# Patient Record
Sex: Female | Born: 1940 | Race: Black or African American | Hispanic: No | Smoking: Current every day smoker
Health system: Southern US, Community
[De-identification: ages and names within clinical notes are randomized; demographics above are authoritative.]

## PROBLEM LIST (undated history)

## (undated) DIAGNOSIS — G20A1 Parkinson's disease without dyskinesia, without mention of fluctuations: Secondary | ICD-10-CM

## (undated) DIAGNOSIS — I509 Heart failure, unspecified: Secondary | ICD-10-CM

## (undated) DIAGNOSIS — K219 Gastro-esophageal reflux disease without esophagitis: Secondary | ICD-10-CM

## (undated) DIAGNOSIS — I2723 Pulmonary hypertension due to lung diseases and hypoxia: Secondary | ICD-10-CM

## (undated) DIAGNOSIS — F0390 Unspecified dementia without behavioral disturbance: Secondary | ICD-10-CM

## (undated) DIAGNOSIS — D696 Thrombocytopenia, unspecified: Secondary | ICD-10-CM

## (undated) DIAGNOSIS — G20 Parkinson's disease: Secondary | ICD-10-CM

## (undated) DIAGNOSIS — R32 Unspecified urinary incontinence: Secondary | ICD-10-CM

## (undated) DIAGNOSIS — M1990 Unspecified osteoarthritis, unspecified site: Secondary | ICD-10-CM

## (undated) DIAGNOSIS — F329 Major depressive disorder, single episode, unspecified: Secondary | ICD-10-CM

## (undated) DIAGNOSIS — F209 Schizophrenia, unspecified: Secondary | ICD-10-CM

## (undated) DIAGNOSIS — F32A Depression, unspecified: Secondary | ICD-10-CM

## (undated) DIAGNOSIS — J449 Chronic obstructive pulmonary disease, unspecified: Secondary | ICD-10-CM

## (undated) DIAGNOSIS — R4702 Dysphasia: Secondary | ICD-10-CM

## (undated) DIAGNOSIS — I10 Essential (primary) hypertension: Secondary | ICD-10-CM

## (undated) DIAGNOSIS — I739 Peripheral vascular disease, unspecified: Secondary | ICD-10-CM

## (undated) HISTORY — DX: Pulmonary hypertension due to lung diseases and hypoxia: I27.23

---

## 2009-11-07 ENCOUNTER — Encounter: Admission: RE | Admit: 2009-11-07 | Discharge: 2009-11-07 | Payer: Self-pay | Admitting: Family Medicine

## 2010-12-02 ENCOUNTER — Other Ambulatory Visit (HOSPITAL_COMMUNITY): Payer: Self-pay | Admitting: Family Medicine

## 2010-12-04 ENCOUNTER — Ambulatory Visit (HOSPITAL_COMMUNITY)
Admission: RE | Admit: 2010-12-04 | Discharge: 2010-12-04 | Disposition: A | Discharge status: Home / Self Care | Payer: Medicare Other | Source: Ambulatory Visit | Attending: Family Medicine | Admitting: Family Medicine

## 2010-12-04 DIAGNOSIS — Z01818 Encounter for other preprocedural examination: Secondary | ICD-10-CM | POA: Insufficient documentation

## 2010-12-04 DIAGNOSIS — R059 Cough, unspecified: Secondary | ICD-10-CM | POA: Insufficient documentation

## 2010-12-04 DIAGNOSIS — R1310 Dysphagia, unspecified: Secondary | ICD-10-CM | POA: Insufficient documentation

## 2010-12-04 DIAGNOSIS — R05 Cough: Secondary | ICD-10-CM | POA: Insufficient documentation

## 2010-12-24 ENCOUNTER — Other Ambulatory Visit: Payer: Self-pay | Admitting: Family Medicine

## 2010-12-24 DIAGNOSIS — Z1231 Encounter for screening mammogram for malignant neoplasm of breast: Secondary | ICD-10-CM

## 2011-01-29 ENCOUNTER — Other Ambulatory Visit: Payer: Medicare Other

## 2011-01-29 ENCOUNTER — Ambulatory Visit: Payer: Medicare Other

## 2011-02-09 ENCOUNTER — Ambulatory Visit: Payer: Medicare Other

## 2011-02-09 ENCOUNTER — Other Ambulatory Visit: Payer: Medicare Other

## 2011-02-17 ENCOUNTER — Other Ambulatory Visit: Payer: Medicare Other

## 2011-02-17 ENCOUNTER — Ambulatory Visit: Payer: Medicare Other

## 2011-03-05 ENCOUNTER — Ambulatory Visit
Admission: RE | Admit: 2011-03-05 | Discharge: 2011-03-05 | Disposition: A | Discharge status: Home / Self Care | Payer: Medicare Other | Source: Ambulatory Visit | Attending: Family Medicine | Admitting: Family Medicine

## 2011-03-05 DIAGNOSIS — Z1231 Encounter for screening mammogram for malignant neoplasm of breast: Secondary | ICD-10-CM

## 2011-06-15 ENCOUNTER — Emergency Department (HOSPITAL_COMMUNITY)
Admission: EM | Admit: 2011-06-15 | Discharge: 2011-06-15 | Disposition: A | Discharge status: Skilled Nursing Facility | Payer: Medicare Other | Attending: Emergency Medicine | Admitting: Emergency Medicine

## 2011-06-15 ENCOUNTER — Emergency Department (HOSPITAL_COMMUNITY): Payer: Medicare Other

## 2011-06-15 ENCOUNTER — Encounter (HOSPITAL_COMMUNITY): Payer: Self-pay | Admitting: *Deleted

## 2011-06-15 DIAGNOSIS — I498 Other specified cardiac arrhythmias: Secondary | ICD-10-CM | POA: Insufficient documentation

## 2011-06-15 DIAGNOSIS — Z8659 Personal history of other mental and behavioral disorders: Secondary | ICD-10-CM | POA: Insufficient documentation

## 2011-06-15 DIAGNOSIS — I10 Essential (primary) hypertension: Secondary | ICD-10-CM | POA: Insufficient documentation

## 2011-06-15 DIAGNOSIS — J449 Chronic obstructive pulmonary disease, unspecified: Secondary | ICD-10-CM | POA: Insufficient documentation

## 2011-06-15 DIAGNOSIS — J4489 Other specified chronic obstructive pulmonary disease: Secondary | ICD-10-CM | POA: Insufficient documentation

## 2011-06-15 DIAGNOSIS — Z79899 Other long term (current) drug therapy: Secondary | ICD-10-CM | POA: Insufficient documentation

## 2011-06-15 DIAGNOSIS — N390 Urinary tract infection, site not specified: Secondary | ICD-10-CM

## 2011-06-15 DIAGNOSIS — H409 Unspecified glaucoma: Secondary | ICD-10-CM | POA: Insufficient documentation

## 2011-06-15 DIAGNOSIS — I739 Peripheral vascular disease, unspecified: Secondary | ICD-10-CM | POA: Insufficient documentation

## 2011-06-15 DIAGNOSIS — F209 Schizophrenia, unspecified: Secondary | ICD-10-CM | POA: Insufficient documentation

## 2011-06-15 DIAGNOSIS — K219 Gastro-esophageal reflux disease without esophagitis: Secondary | ICD-10-CM | POA: Insufficient documentation

## 2011-06-15 DIAGNOSIS — M810 Age-related osteoporosis without current pathological fracture: Secondary | ICD-10-CM | POA: Insufficient documentation

## 2011-06-15 DIAGNOSIS — M129 Arthropathy, unspecified: Secondary | ICD-10-CM | POA: Insufficient documentation

## 2011-06-15 DIAGNOSIS — R002 Palpitations: Secondary | ICD-10-CM | POA: Insufficient documentation

## 2011-06-15 HISTORY — DX: Schizophrenia, unspecified: F20.9

## 2011-06-15 HISTORY — DX: Peripheral vascular disease, unspecified: I73.9

## 2011-06-15 HISTORY — DX: Essential (primary) hypertension: I10

## 2011-06-15 HISTORY — DX: Unspecified osteoarthritis, unspecified site: M19.90

## 2011-06-15 HISTORY — DX: Chronic obstructive pulmonary disease, unspecified: J44.9

## 2011-06-15 HISTORY — DX: Gastro-esophageal reflux disease without esophagitis: K21.9

## 2011-06-15 LAB — URINALYSIS, ROUTINE W REFLEX MICROSCOPIC
Bilirubin Urine: NEGATIVE
Ketones, ur: NEGATIVE mg/dL
Nitrite: POSITIVE — AB
Protein, ur: NEGATIVE mg/dL
Specific Gravity, Urine: 1.013 (ref 1.005–1.030)
Urobilinogen, UA: 0.2 mg/dL (ref 0.0–1.0)

## 2011-06-15 LAB — URINE MICROSCOPIC-ADD ON

## 2011-06-15 LAB — POCT I-STAT, CHEM 8
Chloride: 103 mEq/L (ref 96–112)
Glucose, Bld: 86 mg/dL (ref 70–99)
HCT: 41 % (ref 36.0–46.0)
Hemoglobin: 13.9 g/dL (ref 12.0–15.0)
Potassium: 4.3 mEq/L (ref 3.5–5.1)

## 2011-06-15 MED ORDER — CEPHALEXIN 500 MG PO CAPS: 500.0000 mg | ORAL_CAPSULE | Freq: Four times a day (QID) | ORAL | Status: AC

## 2011-06-15 NOTE — ED Notes (Signed)
From [facility] - staff reports noticed irregular pulse today. Pt denies any CP, palpitations. No voiced complaints

## 2011-06-15 NOTE — ED Notes (Signed)
Transport patient to [facility]

## 2011-06-15 NOTE — ED Provider Notes (Addendum)
Medical screening examination/treatment/procedure(s) were performed by non-physician practitioner and as supervising physician I was immediately available for consultation/collaboration.  Patient being evaluated for irregular heart beats. She is otherwise she does not know of any urinary tract symptoms. Evaluation is negative for toxic or metabolic abnormalities. Doubt ACS. Possible urinary tract infection. Patient stable for discharge.  Miranda Blade, MD 06/15/11 [place], MD 06/16/11 1911

## 2011-06-15 NOTE — ED Notes (Signed)
Placed call for [facility] for transport back home

## 2011-06-15 NOTE — ED Notes (Signed)
Pt denies SOB, CP, palpitations. Is unaware as to why she was sent here. No voiced complaints

## 2011-06-15 NOTE — ED Provider Notes (Signed)
History     CSN: [id number]  Arrival date & time 06/15/11  1815   First MD Initiated Contact with Patient 06/15/11 1847      Chief Complaint  Patient presents with  . Irregular Heart Beat    (Consider location/radiation/quality/duration/timing/severity/associated sxs/prior treatment) HPI Comments: Patient comes in today from [facility] because a nurse at the facility noticed that her heartbeat was irregular today while she was checking her pulse.  Patient reports that she felt her heart "flutter" today. She has never felt this before.  She denies any chest pain or SOB.  She denies nausea, vomiting, or diaphoresis.  Patient has a PMH significant for COPD and HTN.  No history of CAD or cardiac arrythmia.  Son also reports that the staff at the facility had noted that the patient is not smoking as much as usual, which they found to be unusual.   Staff at the nursing home has not noticed any increased confusion.  Patient denies any dysuria.  Patient is incontinent of urine at baseline.    The history is provided by the patient (son).    Past Medical History  Diagnosis Date  . Hypertension   . COPD (chronic obstructive pulmonary disease)   . Arthritis   . Schizophrenia   . Osteoporosis   . GERD (gastroesophageal reflux disease)   . Peripheral vascular disease   . Glaucoma     No past surgical history on file.  No family history on file.  History  Substance Use Topics  . Smoking status: Current Everyday Smoker  . Smokeless tobacco: Not on file  . Alcohol Use: No    OB History    Grav Para Term Preterm Abortions TAB SAB Ect Mult Living                  Review of Systems  Constitutional: Negative for fever, chills and diaphoresis.  HENT: Negative for neck pain and neck stiffness.   Respiratory: Negative for cough, shortness of breath and wheezing.   Cardiovascular: Positive for palpitations. Negative for chest pain.  Gastrointestinal: Negative for nausea, vomiting  and abdominal pain.  Genitourinary: Negative for dysuria and decreased urine volume.  Neurological: Negative for dizziness, syncope and light-headedness.  Psychiatric/Behavioral: Negative for confusion.    Allergies  Review of patient's allergies indicates no known allergies.  Home Medications   Current Outpatient Rx  Name Route Sig Dispense Refill  . ALBUTEROL SULFATE HFA 108 (90 BASE) MCG/ACT IN AERS Inhalation Inhale 2 puffs into the lungs every 6 (six) hours as needed. For wheezing    . CALCIUM CARBONATE-VITAMIN D 600-400 MG-UNIT PO TABS Oral Take 1 tablet by mouth 2 (two) times daily.    Marland Kitchen DIVALPROEX SODIUM 250 MG PO TBEC Oral Take 250 mg by mouth every morning.     Marland Kitchen DIVALPROEX SODIUM 500 MG PO TBEC Oral Take 500 mg by mouth at bedtime.    Marland Kitchen FLUTICASONE PROPIONATE 50 MCG/ACT NA SUSP Nasal Place 2 sprays into the nose daily.    Marland Kitchen LOPERAMIDE HCL 2 MG PO CAPS Oral Take 2 mg by mouth every 8 (eight) hours as needed. For loose stools    . OMEPRAZOLE 20 MG PO CPDR Oral Take 20 mg by mouth daily.    Marland Kitchen OXYMETAZOLINE HCL 0.05 % NA SOLN Nasal Place 2 sprays into the nose 2 (two) times daily as needed. For nose bleed    . RISPERIDONE 2 MG PO TBDP Oral Take 2 mg by mouth 2 (  two) times daily.    . TOLTERODINE TARTRATE ER 4 MG PO CP24 Oral Take 4 mg by mouth at bedtime.      BP 166/87  Temp(Src) 97.9 F (36.6 C) (Oral)  Resp 16  SpO2 95%  Physical Exam  Nursing note and vitals reviewed. Constitutional: She appears well-developed and well-nourished. No distress.  HENT:  Head: Normocephalic and atraumatic.  Mouth/Throat: Oropharynx is clear and moist.  Eyes: EOM are normal. Pupils are equal, round, and reactive to light.  Neck: Normal range of motion. Neck supple.  Cardiovascular: Normal rate and normal heart sounds.  An irregular rhythm present.  No murmur heard. Pulmonary/Chest: Effort normal and breath sounds normal. No respiratory distress. She has no wheezes. She has no rales. She  exhibits no tenderness.  Abdominal: Soft. She exhibits no distension and no mass. There is no tenderness. There is no rebound, no guarding and no CVA tenderness.  Neurological: She is alert.  Skin: Skin is warm and dry. No rash noted. She is not diaphoretic.  Psychiatric: She has a normal mood and affect.    ED Course  Procedures (including critical care time)  Labs Reviewed - No data to display No results found.   No diagnosis found.   Date: 06/16/2011  Rate: 69  Rhythm: premature ventricular contractions (PVC)  QRS Axis: normal  Intervals: normal  ST/T Wave abnormalities: nonspecific T wave changes  Conduction Disutrbances:none  Narrative Interpretation:   Old EKG Reviewed: none available    MDM  Patient comes in today after she was found to have an irregular heart beat at the Nursing Home that she lives at.  She reports that she could feel her heart "flutter"  No prior history of cardiac arrythmia.  PVC's noted on EKG.  She denies CP or SOB.  Labs unremarkable.  UA shows UTI.  Patient given Rx and urine culture sent.          Sherlyn Lees [place], PA-C 06/16/11 1534

## 2011-06-15 NOTE — ED Notes (Signed)
Patient transported to X-ray 

## 2011-06-15 NOTE — ED Notes (Signed)
EKG given to MD, no old EKG, copy placed in chart.

## 2011-06-15 NOTE — ED Notes (Signed)
Waiting for [facility] for transport back to facility

## 2011-06-15 NOTE — Discharge Instructions (Signed)
Urinary Tract Infection Infections of the urinary tract can start in several places. A bladder infection (cystitis), a kidney infection (pyelonephritis), and a prostate infection (prostatitis) are different types of urinary tract infections (UTIs). They usually get better if treated with medicines (antibiotics) that kill germs. Take all the medicine until it is gone. You or your child may feel better in a few days, but TAKE ALL MEDICINE or the infection may not respond and may become more difficult to treat. HOME CARE INSTRUCTIONS   Drink enough water and fluids to keep the urine clear or pale yellow. Cranberry juice is especially recommended, in addition to large amounts of water.   Avoid caffeine, tea, and carbonated beverages. They tend to irritate the bladder.   Alcohol may irritate the prostate.   Only take over-the-counter or prescription medicines for pain, discomfort, or fever as directed by your caregiver.  To prevent further infections:  Empty the bladder often. Avoid holding urine for long periods of time.   After a bowel movement, women should cleanse from front to back. Use each tissue only once.   Empty the bladder before and after sexual intercourse.  FINDING OUT THE RESULTS OF YOUR TEST Not all test results are available during your visit. If your or your child's test results are not back during the visit, make an appointment with your caregiver to find out the results. Do not assume everything is normal if you have not heard from your caregiver or the medical facility. It is important for you to follow up on all test results. SEEK MEDICAL CARE IF:   There is back pain.   Your baby is older than 3 months with a rectal temperature of 100.5 F (38.1 C) or higher for more than 1 day.   Your or your child's problems (symptoms) are no better in 3 days. Return sooner if you or your child is getting worse.  SEEK IMMEDIATE MEDICAL CARE IF:   There is severe back pain or lower  abdominal pain.   You or your child develops chills.   You have a fever.   Your baby is older than 3 months with a rectal temperature of 102 F (38.9 C) or higher.   Your baby is 63 months old or younger with a rectal temperature of 100.4 F (38 C) or higher.   There is nausea or vomiting.   There is continued burning or discomfort with urination.  MAKE SURE YOU:   Understand these instructions.   Will watch your condition.   Will get help right away if you are not doing well or get worse.  Document Released: 10/29/2004 Document Revised: 01/08/2011 Document Reviewed: 06/03/2006 [facility] Patient Information 2012 [place].

## 2011-06-16 NOTE — ED Provider Notes (Signed)
Medical screening examination/treatment/procedure(s) were conducted as a shared visit with non-physician practitioner(s) and myself.  I personally evaluated the patient during the encounter  Richarda Blade, MD 06/16/11 [phone]

## 2011-06-17 LAB — URINE CULTURE

## 2011-06-18 NOTE — ED Notes (Signed)
+  Urine Patient treated with keflex-sensitive to same-chart appended per protocol MD.

## 2011-07-08 ENCOUNTER — Ambulatory Visit (INDEPENDENT_AMBULATORY_CARE_PROVIDER_SITE_OTHER): Payer: Medicare Other | Admitting: Emergency Medicine

## 2011-07-08 ENCOUNTER — Ambulatory Visit: Payer: Medicare Other

## 2011-07-08 VITALS — BP 148/64 | HR 61 | Temp 98.0°F | Resp 20 | Wt 120.0 lb

## 2011-07-08 DIAGNOSIS — R059 Cough, unspecified: Secondary | ICD-10-CM

## 2011-07-08 DIAGNOSIS — J449 Chronic obstructive pulmonary disease, unspecified: Secondary | ICD-10-CM

## 2011-07-08 DIAGNOSIS — R05 Cough: Secondary | ICD-10-CM

## 2011-07-08 DIAGNOSIS — N390 Urinary tract infection, site not specified: Secondary | ICD-10-CM

## 2011-07-08 DIAGNOSIS — I7389 Other specified peripheral vascular diseases: Secondary | ICD-10-CM

## 2011-07-08 DIAGNOSIS — N3000 Acute cystitis without hematuria: Secondary | ICD-10-CM

## 2011-07-08 DIAGNOSIS — R300 Dysuria: Secondary | ICD-10-CM

## 2011-07-08 LAB — POCT UA - MICROSCOPIC ONLY
Casts, Ur, LPF, POC: NEGATIVE
Crystals, Ur, HPF, POC: NEGATIVE

## 2011-07-08 LAB — POCT URINALYSIS DIPSTICK
Bilirubin, UA: NEGATIVE
Glucose, UA: NEGATIVE
Nitrite, UA: POSITIVE
Spec Grav, UA: <=1.005
Urobilinogen, UA: 0.2
pH, UA: 6

## 2011-07-08 MED ORDER — CIPROFLOXACIN HCL 500 MG PO TABS: 500.0000 mg | ORAL_TABLET | Freq: Two times a day (BID) | ORAL | Status: AC

## 2011-07-08 NOTE — Progress Notes (Signed)
Subjective:    Patient ID: Miranda Ballard, female    DOB: Jul 29, 1940, 71 y.o.   MRN: [id number]  Cough This is a new problem. The current episode started in the past 7 days. The problem has been gradually worsening. The problem occurs every few minutes. The cough is productive of purulent sputum. Associated symptoms include a fever. Pertinent negatives include no chest pain, chills, ear congestion, ear pain, headaches, heartburn, hemoptysis, myalgias, nasal congestion, postnasal drip, rash, rhinorrhea, sore throat, shortness of breath, sweats, weight loss or wheezing. The symptoms are aggravated by lying down and exercise. She has tried a beta-agonist inhaler for the symptoms. The treatment provided mild relief. Her past medical history is significant for bronchitis, COPD and emphysema.  URI  This is a recurrent problem. The current episode started today. The problem has been unchanged. The maximum temperature recorded prior to her arrival was 101 - 101.9 F. The fever has been present for 1 to 2 days. Associated symptoms include coughing and dysuria. Pertinent negatives include no chest pain, ear pain, headaches, joint pain, joint swelling, nausea, neck pain, rash, rhinorrhea, sore throat or wheezing. She has tried nothing for the symptoms.  Hypertension This is a chronic problem. The current episode started more than 1 year ago. The problem is unchanged. The problem is controlled. Pertinent negatives include no chest pain, headaches, neck pain, shortness of breath or sweats. There are no associated agents to hypertension. Risk factors for coronary artery disease include family history, post-menopausal state, sedentary lifestyle and smoking/tobacco exposure.      Review of Systems  Constitutional: Positive for fever, appetite change and fatigue. Negative for chills, weight loss, diaphoresis and activity change.  HENT: Negative for ear pain, sore throat, rhinorrhea, neck pain and postnasal drip.   Eyes:  Negative.   Respiratory: Positive for cough. Negative for hemoptysis, choking, chest tightness, shortness of breath, wheezing and stridor.   Cardiovascular: Negative for chest pain.  Gastrointestinal: Negative.  Negative for heartburn and nausea.  Genitourinary: Positive for dysuria, urgency and enuresis.  Musculoskeletal: Negative.  Negative for myalgias and joint pain.  Skin: Negative for rash.  Neurological: Positive for speech difficulty. Negative for dizziness, seizures, facial asymmetry and headaches.       Objective:   Physical Exam  Constitutional: She appears cachectic. She is active and uncooperative.  Non-toxic appearance. She does not have a sickly appearance. She appears ill. No distress.  HENT:  Head: Normocephalic and atraumatic.  Right Ear: External ear normal.  Left Ear: External ear normal.  Mouth/Throat: Oropharynx is clear and moist.  Eyes: Conjunctivae and EOM are normal. Pupils are equal, round, and reactive to light.  Neck: Normal range of motion. Neck supple. No tracheal deviation present. No thyromegaly present.  Cardiovascular: Normal rate, regular rhythm and normal heart sounds.   Pulmonary/Chest: Effort normal. No respiratory distress. She has rales.  Abdominal: Soft. There is no tenderness.  Neurological: She is disoriented. She displays atrophy. She exhibits abnormal muscle tone. Coordination abnormal.  Skin: Skin is warm and dry.          Assessment & Plan:   Results for orders placed in visit on 07/08/11  POCT URINALYSIS DIPSTICK      Component Value Range   Color, UA yellow     Clarity, UA cloudy     Glucose, UA neg     Bilirubin, UA neg     Ketones, UA neg     Spec Grav, UA <=1.005     Blood,  UA small     pH, UA 6.0     Protein, UA neg     Urobilinogen, UA 0.2     Nitrite, UA pos     Leukocytes, UA small (1+)    POCT UA - MICROSCOPIC ONLY      Component Value Range   WBC, Ur, HPF, POC 20-30     RBC, urine, microscopic 1-3      Bacteria, U Microscopic 3+     Mucus, UA neg     Epithelial cells, urine per micros 1-4     Crystals, Ur, HPF, POC neg     Casts, Ur, LPF, POC neg     Yeast, UA neg

## 2011-07-08 NOTE — Patient Instructions (Signed)
Urinary Tract Infection Infections of the urinary tract can start in several places. A bladder infection (cystitis), a kidney infection (pyelonephritis), and a prostate infection (prostatitis) are different types of urinary tract infections (UTIs). They usually get better if treated with medicines (antibiotics) that kill germs. Take all the medicine until it is gone. You or your child may feel better in a few days, but TAKE ALL MEDICINE or the infection may not respond and may become more difficult to treat. HOME CARE INSTRUCTIONS   Drink enough water and fluids to keep the urine clear or pale yellow. Cranberry juice is especially recommended, in addition to large amounts of water.   Avoid caffeine, tea, and carbonated beverages. They tend to irritate the bladder.   Alcohol may irritate the prostate.   Only take over-the-counter or prescription medicines for pain, discomfort, or fever as directed by your caregiver.  To prevent further infections:  Empty the bladder often. Avoid holding urine for long periods of time.   After a bowel movement, women should cleanse from front to back. Use each tissue only once.   Empty the bladder before and after sexual intercourse.  FINDING OUT THE RESULTS OF YOUR TEST Not all test results are available during your visit. If your or your child's test results are not back during the visit, make an appointment with your caregiver to find out the results. Do not assume everything is normal if you have not heard from your caregiver or the medical facility. It is important for you to follow up on all test results. SEEK MEDICAL CARE IF:   There is back pain.   Your baby is older than 3 months with a rectal temperature of 100.5 F (38.1 C) or higher for more than 1 day.   Your or your child's problems (symptoms) are no better in 3 days. Return sooner if you or your child is getting worse.  SEEK IMMEDIATE MEDICAL CARE IF:   There is severe back pain or lower  abdominal pain.   You or your child develops chills.   You have a fever.   Your baby is older than 3 months with a rectal temperature of 102 F (38.9 C) or higher.   Your baby is 54 months old or younger with a rectal temperature of 100.4 F (38 C) or higher.   There is nausea or vomiting.   There is continued burning or discomfort with urination.  MAKE SURE YOU:   Understand these instructions.   Will watch your condition.   Will get help right away if you are not doing well or get worse.  Document Released: 10/29/2004 Document Revised: 01/08/2011 Document Reviewed: 06/03/2006 [facility] Patient Information 2012 [place].

## 2011-07-08 NOTE — Progress Notes (Signed)
  Subjective:    Patient ID: Miranda Ballard, female    DOB: May 08, 1940, 71 y.o.   MRN: [id number]  HPI    Review of Systems     Objective:   Physical Exam        Assessment & Plan:  [facility] reading (PRIMARY) by  Dr.Marquerite Forsman.  Negative.

## 2012-01-23 ENCOUNTER — Emergency Department (HOSPITAL_COMMUNITY): Payer: Medicare Other

## 2012-01-23 ENCOUNTER — Encounter (HOSPITAL_COMMUNITY): Payer: Self-pay | Admitting: Emergency Medicine

## 2012-01-23 ENCOUNTER — Inpatient Hospital Stay (HOSPITAL_COMMUNITY): Payer: Medicare Other

## 2012-01-23 ENCOUNTER — Inpatient Hospital Stay (HOSPITAL_COMMUNITY)
Admission: EM | Admit: 2012-01-23 | Discharge: 2012-01-26 | DRG: 194 | Disposition: A | Discharge status: Skilled Nursing Facility | Payer: Medicare Other | Attending: Internal Medicine | Admitting: Internal Medicine

## 2012-01-23 DIAGNOSIS — R0602 Shortness of breath: Secondary | ICD-10-CM | POA: Diagnosis present

## 2012-01-23 DIAGNOSIS — J189 Pneumonia, unspecified organism: Principal | ICD-10-CM | POA: Diagnosis present

## 2012-01-23 DIAGNOSIS — E871 Hypo-osmolality and hyponatremia: Secondary | ICD-10-CM | POA: Diagnosis present

## 2012-01-23 DIAGNOSIS — J4489 Other specified chronic obstructive pulmonary disease: Secondary | ICD-10-CM | POA: Diagnosis present

## 2012-01-23 DIAGNOSIS — D72829 Elevated white blood cell count, unspecified: Secondary | ICD-10-CM | POA: Diagnosis present

## 2012-01-23 DIAGNOSIS — I739 Peripheral vascular disease, unspecified: Secondary | ICD-10-CM | POA: Diagnosis present

## 2012-01-23 DIAGNOSIS — K219 Gastro-esophageal reflux disease without esophagitis: Secondary | ICD-10-CM | POA: Diagnosis present

## 2012-01-23 DIAGNOSIS — J449 Chronic obstructive pulmonary disease, unspecified: Secondary | ICD-10-CM

## 2012-01-23 DIAGNOSIS — F209 Schizophrenia, unspecified: Secondary | ICD-10-CM | POA: Diagnosis present

## 2012-01-23 DIAGNOSIS — Z79899 Other long term (current) drug therapy: Secondary | ICD-10-CM

## 2012-01-23 DIAGNOSIS — H409 Unspecified glaucoma: Secondary | ICD-10-CM | POA: Diagnosis present

## 2012-01-23 LAB — URINALYSIS, ROUTINE W REFLEX MICROSCOPIC
Bilirubin Urine: NEGATIVE
Glucose, UA: NEGATIVE mg/dL
Hgb urine dipstick: NEGATIVE
Ketones, ur: NEGATIVE mg/dL
Leukocytes, UA: NEGATIVE
Nitrite: NEGATIVE
Protein, ur: NEGATIVE mg/dL
Specific Gravity, Urine: 1.012 (ref 1.005–1.030)
Urobilinogen, UA: 0.2 mg/dL (ref 0.0–1.0)
pH: 8 (ref 5.0–8.0)

## 2012-01-23 LAB — BASIC METABOLIC PANEL
BUN: 10 mg/dL (ref 6–23)
CO2: 24 mEq/L (ref 19–32)
Calcium: 10.1 mg/dL (ref 8.4–10.5)
Chloride: 93 mEq/L — ABNORMAL LOW (ref 96–112)
Creatinine, Ser: 0.86 mg/dL (ref 0.50–1.10)
GFR calc Af Amer: 78 mL/min — ABNORMAL LOW (ref >90)
GFR calc non Af Amer: 67 mL/min — ABNORMAL LOW (ref >90)
Glucose, Bld: 88 mg/dL (ref 70–99)
Potassium: 4.6 mEq/L (ref 3.5–5.1)
Sodium: 126 mEq/L — ABNORMAL LOW (ref 135–145)

## 2012-01-23 LAB — CBC WITH DIFFERENTIAL/PLATELET
Basophils Absolute: 0 10*3/uL (ref 0.0–0.1)
Basophils Relative: 0 % (ref 0–1)
Eosinophils Absolute: 0 10*3/uL (ref 0.0–0.7)
Eosinophils Relative: 0 % (ref 0–5)
HCT: 37.5 % (ref 36.0–46.0)
Hemoglobin: 12.8 g/dL (ref 12.0–15.0)
Lymphocytes Relative: 9 % — ABNORMAL LOW (ref 12–46)
Lymphs Abs: 1.1 10*3/uL (ref 0.7–4.0)
MCH: 33.1 pg (ref 26.0–34.0)
MCHC: 34.1 g/dL (ref 30.0–36.0)
MCV: 96.9 fL (ref 78.0–100.0)
Monocytes Absolute: 1.9 10*3/uL — ABNORMAL HIGH (ref 0.1–1.0)
Monocytes Relative: 15 % — ABNORMAL HIGH (ref 3–12)
Neutro Abs: 9.9 10*3/uL — ABNORMAL HIGH (ref 1.7–7.7)
Neutrophils Relative %: 76 % (ref 43–77)
Platelets: 179 10*3/uL (ref 150–400)
RBC: 3.87 MIL/uL (ref 3.87–5.11)
RDW: 13 % (ref 11.5–15.5)
WBC: 13 10*3/uL — ABNORMAL HIGH (ref 4.0–10.5)

## 2012-01-23 LAB — LACTIC ACID, PLASMA: Lactic Acid, Venous: 1.8 mmol/L (ref 0.5–2.2)

## 2012-01-23 LAB — STREP PNEUMONIAE URINARY ANTIGEN: Strep Pneumo Urinary Antigen: NEGATIVE

## 2012-01-23 MED ORDER — DIVALPROEX SODIUM 250 MG PO DR TAB
250.0000 mg | DELAYED_RELEASE_TABLET | Freq: Every morning | ORAL | Status: DC
  Administered 2012-01-23 – 2012-01-26 (×4): 250 mg via ORAL
  Filled 2012-01-23 (×4): qty 1

## 2012-01-23 MED ORDER — LORATADINE 10 MG PO TABS
10.0000 mg | ORAL_TABLET | Freq: Every day | ORAL | Status: DC
  Administered 2012-01-23 – 2012-01-26 (×4): 10 mg via ORAL
  Filled 2012-01-23 (×4): qty 1

## 2012-01-23 MED ORDER — DEXTROSE 5 % IV SOLN
1.0000 g | Freq: Once | INTRAVENOUS | Status: AC
  Administered 2012-01-23: 1 g via INTRAVENOUS
  Filled 2012-01-23: qty 10

## 2012-01-23 MED ORDER — SODIUM CHLORIDE 0.9 % IV SOLN
INTRAVENOUS | Status: AC
  Administered 2012-01-23: 14:00:00 via INTRAVENOUS

## 2012-01-23 MED ORDER — FLUTICASONE PROPIONATE 50 MCG/ACT NA SUSP
2.0000 | Freq: Every day | NASAL | Status: DC
  Administered 2012-01-23 – 2012-01-26 (×4): 2 via NASAL
  Filled 2012-01-23: qty 16

## 2012-01-23 MED ORDER — ENOXAPARIN SODIUM 30 MG/0.3ML ~~LOC~~ SOLN
30.0000 mg | SUBCUTANEOUS | Status: DC
  Administered 2012-01-23: 30 mg via SUBCUTANEOUS
  Filled 2012-01-23 (×2): qty 0.3

## 2012-01-23 MED ORDER — DEXTROSE 5 % IV SOLN
500.0000 mg | Freq: Once | INTRAVENOUS | Status: AC
  Administered 2012-01-23: 500 mg via INTRAVENOUS
  Filled 2012-01-23: qty 500

## 2012-01-23 MED ORDER — LEVOFLOXACIN IN D5W 750 MG/150ML IV SOLN
750.0000 mg | INTRAVENOUS | Status: DC
  Administered 2012-01-23 – 2012-01-26 (×4): 750 mg via INTRAVENOUS
  Filled 2012-01-23 (×4): qty 150

## 2012-01-23 MED ORDER — MOMETASONE FURO-FORMOTEROL FUM 100-5 MCG/ACT IN AERO
2.0000 | INHALATION_SPRAY | Freq: Two times a day (BID) | RESPIRATORY_TRACT | Status: DC
  Administered 2012-01-23 – 2012-01-26 (×7): 2 via RESPIRATORY_TRACT
  Filled 2012-01-23: qty 8.8

## 2012-01-23 MED ORDER — ALBUTEROL SULFATE HFA 108 (90 BASE) MCG/ACT IN AERS
2.0000 | INHALATION_SPRAY | Freq: Four times a day (QID) | RESPIRATORY_TRACT | Status: DC
  Administered 2012-01-23 – 2012-01-26 (×10): 2 via RESPIRATORY_TRACT
  Filled 2012-01-23: qty 6.7

## 2012-01-23 MED ORDER — LOPERAMIDE HCL 2 MG PO CAPS: 2.0000 mg | ORAL_CAPSULE | Freq: Three times a day (TID) | ORAL | Status: DC | PRN

## 2012-01-23 MED ORDER — ASPIRIN 81 MG PO CHEW
324.0000 mg | CHEWABLE_TABLET | Freq: Once | ORAL | Status: AC
  Administered 2012-01-23: 324 mg via ORAL
  Filled 2012-01-23: qty 4

## 2012-01-23 MED ORDER — RISPERIDONE 2 MG PO TBDP
2.0000 mg | ORAL_TABLET | Freq: Two times a day (BID) | ORAL | Status: DC
Start: 2012-01-23 — End: 2012-01-26
  Administered 2012-01-23 – 2012-01-26 (×7): 2 mg via ORAL
  Filled 2012-01-23 (×9): qty 1

## 2012-01-23 MED ORDER — OXYMETAZOLINE HCL 0.05 % NA SOLN
2.0000 | Freq: Two times a day (BID) | NASAL | Status: DC | PRN
  Administered 2012-01-24: 2 via NASAL
  Filled 2012-01-23: qty 15

## 2012-01-23 MED ORDER — DEXTROSE 5 % IV SOLN
1.0000 g | Freq: Three times a day (TID) | INTRAVENOUS | Status: DC
  Administered 2012-01-23 – 2012-01-26 (×10): 1 g via INTRAVENOUS
  Filled 2012-01-23 (×11): qty 1

## 2012-01-23 MED ORDER — VANCOMYCIN HCL 500 MG IV SOLR
500.0000 mg | Freq: Two times a day (BID) | INTRAVENOUS | Status: DC
  Administered 2012-01-23 – 2012-01-25 (×4): 500 mg via INTRAVENOUS
  Filled 2012-01-23 (×5): qty 500

## 2012-01-23 MED ORDER — AZITHROMYCIN 500 MG PO TABS
500.0000 mg | ORAL_TABLET | ORAL | Status: DC
  Filled 2012-01-23: qty 1

## 2012-01-23 MED ORDER — PANTOPRAZOLE SODIUM 40 MG PO TBEC
40.0000 mg | DELAYED_RELEASE_TABLET | Freq: Every day | ORAL | Status: DC
  Administered 2012-01-23 – 2012-01-26 (×4): 40 mg via ORAL
  Filled 2012-01-23 (×5): qty 1

## 2012-01-23 MED ORDER — MONTELUKAST SODIUM 10 MG PO TABS
10.0000 mg | ORAL_TABLET | Freq: Every day | ORAL | Status: DC
  Administered 2012-01-23 – 2012-01-26 (×4): 10 mg via ORAL
  Filled 2012-01-23 (×4): qty 1

## 2012-01-23 MED ORDER — FESOTERODINE FUMARATE ER 4 MG PO TB24
8.0000 mg | ORAL_TABLET | Freq: Every day | ORAL | Status: DC
  Administered 2012-01-23 – 2012-01-26 (×4): 8 mg via ORAL
  Filled 2012-01-23 (×4): qty 2

## 2012-01-23 MED ORDER — DEXTROSE 5 % IV SOLN
1.0000 g | INTRAVENOUS | Status: DC
  Filled 2012-01-23: qty 10

## 2012-01-23 MED ORDER — DIVALPROEX SODIUM 500 MG PO DR TAB
500.0000 mg | DELAYED_RELEASE_TABLET | Freq: Every day | ORAL | Status: DC
  Administered 2012-01-23 – 2012-01-25 (×3): 500 mg via ORAL
  Filled 2012-01-23 (×5): qty 1

## 2012-01-23 NOTE — Progress Notes (Signed)
ANTIBIOTIC CONSULT NOTE - INITIAL  Pharmacy Consult for Vancomycin/renal adjustment of other antibiotics Indication: pneumonia  No Known Allergies  Patient Measurements:   Weight from July 2013: 54.4 kg - nursing to update weight and height and enter in EPIC  Vital Signs: Temp: 98.7 F (37.1 C) (12/21 1030) Temp src: Oral (12/21 1030) BP: 120/82 mmHg (12/21 1030) Pulse Rate: 67  (12/21 1030) Intake/Output from previous day:   Intake/Output from this shift:    Labs:  [facility] 01/23/12 0459  WBC 13.0*  HGB 12.8  PLT 179  LABCREA --  CREATININE 0.86   CrCl is unknown because there is no height on file for the current visit. No results found for this basename: VANCOTROUGH:2,VANCOPEAK:2,VANCORANDOM:2,GENTTROUGH:2,GENTPEAK:2,GENTRANDOM:2,TOBRATROUGH:2,TOBRAPEAK:2,TOBRARND:2,AMIKACINPEAK:2,AMIKACINTROU:2,AMIKACIN:2, in the last 72 hours   CrCl CG (using weight of 54kg from July 2013): 52 ml/min CrCl N: 69 ml/min  Microbiology: No results found for this or any previous visit (from the past 720 hour(s)).  Medical History: Past Medical History  Diagnosis Date  . Hypertension   . COPD (chronic obstructive pulmonary disease)   . Arthritis   . Schizophrenia   . Osteoporosis   . GERD (gastroesophageal reflux disease)   . Peripheral vascular disease   . Glaucoma(365)     Medications:  Scheduled:    . albuterol  2 puff Inhalation Q6H  . [COMPLETED] aspirin  324 mg Oral Once  . [COMPLETED] azithromycin (ZITHROMAX) 500 MG IVPB  500 mg Intravenous Once  . ceFEPime (MAXIPIME) IV  1 g Intravenous Q8H  . [COMPLETED] cefTRIAXone (ROCEPHIN)  IV  1 g Intravenous Once  . divalproex  250 mg Oral q morning - 10a  . divalproex  500 mg Oral QHS  . enoxaparin  30 mg Subcutaneous Q24H  . fesoterodine  8 mg Oral Daily  . fluticasone  2 spray Each Nare Daily  . levofloxacin (LEVAQUIN) IV  750 mg Intravenous Q24H  . loratadine  10 mg Oral Daily  . mometasone-formoterol  2 puff  Inhalation BID  . montelukast  10 mg Oral Daily  . pantoprazole  40 mg Oral Daily  . risperiDONE  2 mg Oral BID  . [DISCONTINUED] azithromycin  500 mg Oral Q24H  . [DISCONTINUED] cefTRIAXone (ROCEPHIN)  IV  1 g Intravenous Q24H   Anti-infectives     Start     Dose/Rate Route Frequency Ordered Stop   01/23/12 1400   ceFEPIme (MAXIPIME) 1 g in dextrose 5 % 50 mL IVPB        1 g 100 mL/hr over 30 Minutes Intravenous 3 times per day 01/23/12 1131 01/31/12 1359   01/23/12 1145   levofloxacin (LEVAQUIN) IVPB 750 mg        750 mg 100 mL/hr over 90 Minutes Intravenous Every 24 hours 01/23/12 1131 01/26/12 1144   01/23/12 1100   cefTRIAXone (ROCEPHIN) 1 g in dextrose 5 % 50 mL IVPB  Status:  Discontinued        1 g 100 mL/hr over 30 Minutes Intravenous Every 24 hours 01/23/12 1028 01/23/12 1131   01/23/12 1100   azithromycin (ZITHROMAX) tablet 500 mg  Status:  Discontinued        500 mg Oral Every 24 hours 01/23/12 1028 01/23/12 1131   01/23/12 0730   cefTRIAXone (ROCEPHIN) 1 g in dextrose 5 % 50 mL IVPB        1 g 100 mL/hr over 30 Minutes Intravenous  Once 01/23/12 0719 01/23/12 0840   01/23/12 0730   azithromycin (ZITHROMAX) 500  mg in dextrose 5 % 250 mL IVPB        500 mg 250 mL/hr over 60 Minutes Intravenous  Once 01/23/12 0719 01/23/12 0950         Assessment: 71 YO F presented to the ED with SOB, productive cough, fevers and chills. Pharmacy asked to start Vancomycin x 8 days per pneumonia protocol. Patient received 1 dose of azithromycin and ceftriaxone in the ED.  12/21 >> Azithromycin >> 12/21 12/21 >> Ceftriaxone >> 12/21 12/21 >> Levaquin x 3 days >> 12/24 12/21 >> Cefepime x 8 days >> 12/29 12/21 >> Vancomycin x 8 days >> 12/29   Goal of Therapy:  Vancomycin trough level 15-20 mcg/ml  Plan:  1. Start Vancomcyin 500 mg IV every 12 hours 2. Continue Cefepime 1 g every 8 hours (appropriate for renal function) 3. Continue Levaquin 750 mg IV every 24 hours  (appropriate for renal function) 4. Vancomycin trough at steady state 5. Nursing informed to update height and weight in [facility], Pharm.D. Clinical Oncology Pharmacist  Pager # [phone]  01/23/2012,11:39 AM

## 2012-01-23 NOTE — ED Notes (Signed)
Patient transported to X-ray

## 2012-01-23 NOTE — ED Provider Notes (Signed)
History     CSN: [id number]  Arrival date & time 01/23/12  0400   First MD Initiated Contact with Patient 01/23/12 [phone]      Chief Complaint  Patient presents with  . Weakness  . Fever    (Consider location/radiation/quality/duration/timing/severity/associated sxs/prior treatment) HPI Patient presents to the emergency department with fever, and weakness, that started tonight.  Patient, states she's had some cough over the last week.  Patient denies nausea, vomiting, diarrhea, abdominal pain, chest pain, shortness of breath, headache, visual changes, dizziness, or syncope. Patient lives in a nursing home, and was found.the nurse, complaining of weakness, fever.  Patient was not given any treatment prior to arrival. Past Medical History  Diagnosis Date  . Hypertension   . COPD (chronic obstructive pulmonary disease)   . Arthritis   . Schizophrenia   . Osteoporosis   . GERD (gastroesophageal reflux disease)   . Peripheral vascular disease   . Glaucoma(365)     History reviewed. No pertinent past surgical history.  No family history on file.  History  Substance Use Topics  . Smoking status: Current Every Day Smoker  . Smokeless tobacco: Not on file  . Alcohol Use: No    OB History    Grav Para Term Preterm Abortions TAB SAB Ect Mult Living                  Review of Systems All other systems negative except as documented in the HPI. All pertinent positives and negatives as reviewed in the HPI.  Allergies  Review of patient's allergies indicates no known allergies.  Home Medications   Current Outpatient Rx  Name  Route  Sig  Dispense  Refill  . ALBUTEROL SULFATE HFA 108 (90 BASE) MCG/ACT IN AERS   Inhalation   Inhale 2 puffs into the lungs every 6 (six) hours. Scheduled per nursing home mar.For wheezing         . CALCIUM CARBONATE-VITAMIN D 600-400 MG-UNIT PO TABS   Oral   Take 1 tablet by mouth 2 (two) times daily.         Marland Kitchen DIVALPROEX SODIUM 250 MG  PO TBEC   Oral   Take 250 mg by mouth every morning.          Marland Kitchen DIVALPROEX SODIUM 500 MG PO TBEC   Oral   Take 500 mg by mouth at bedtime.         Marland Kitchen FLUTICASONE PROPIONATE 50 MCG/ACT NA SUSP   Nasal   Place 2 sprays into the nose daily. scheduled         . FLUTICASONE-SALMETEROL 250-50 MCG/DOSE IN AEPB   Inhalation   Inhale 1 puff into the lungs every 12 (twelve) hours. scheduled         . LORATADINE 10 MG PO TABS   Oral   Take 10 mg by mouth daily. scheduled         . MONTELUKAST SODIUM 10 MG PO TABS   Oral   Take 10 mg by mouth daily.         Marland Kitchen OMEPRAZOLE 20 MG PO CPDR   Oral   Take 20 mg by mouth daily.         Marland Kitchen RISPERIDONE 2 MG PO TBDP   Oral   Take 2 mg by mouth 2 (two) times daily.         . TOLTERODINE TARTRATE ER 4 MG PO CP24   Oral   Take 4 mg by mouth  at bedtime.         Marland Kitchen LOPERAMIDE HCL 2 MG PO CAPS   Oral   Take 2 mg by mouth every 8 (eight) hours as needed. For loose stools         . OXYMETAZOLINE HCL 0.05 % NA SOLN   Nasal   Place 2 sprays into the nose 2 (two) times daily as needed. For nose bleed           BP 141/84  Pulse 78  Temp 101.1 F (38.4 C) (Oral)  Resp 20  SpO2 96%  Physical Exam  Nursing note and vitals reviewed. Constitutional: She is oriented to person, place, and time. She appears well-developed and well-nourished. No distress.  HENT:  Head: Normocephalic and atraumatic.  Mouth/Throat: Oropharynx is clear and moist.  Eyes: Pupils are equal, round, and reactive to light.  Neck: Normal range of motion. Neck supple.  Cardiovascular: Normal rate, regular rhythm and normal heart sounds.  Exam reveals no gallop and no friction rub.   No murmur heard. Pulmonary/Chest: Effort normal. No respiratory distress. She has no rales.       Patient has some congested Lung sounds bilaterally  Neurological: She is alert and oriented to person, place, and time.  Skin: Skin is warm and dry. No rash noted.    ED  Course  Procedures (including critical care time)   Labs Reviewed  CBC WITH DIFFERENTIAL  BASIC METABOLIC PANEL  URINALYSIS, ROUTINE W REFLEX MICROSCOPIC  URINE CULTURE  LACTIC ACID, PLASMA  CULTURE, BLOOD (ROUTINE X 2)  CULTURE, BLOOD (ROUTINE X 2)  TROPONIN I  patient be evaluated for source of fever. Dr. Venora Maples is taking over the patient in sign out. We discussed the patient is great detail. MDM  MDM Reviewed: vitals and nursing note Interpretation: labs and x-ray           Brent General, PA-C 01/23/12 2010

## 2012-01-23 NOTE — ED Notes (Signed)
chan NT at bedside obtaining blood

## 2012-01-23 NOTE — H&P (Signed)
[facility] History and Physical  Miranda Ballard RQS:[id number] DOB: [birth year] DOA: 01/23/2012  Referring physician: ED physician PCP: Rachell Cipro, MD   Chief Complaint: Shortness of breath  HPI:  Pt is 71 yo female, resident of [facility], who presented to [name] ED with main concern of progressively worsening shortness of breath that initially started several days prior to admission, associated with productive cough of yellow sputum, subjective fevers chills, intermittent changes in mental status, poor oral intake. Pt denies chest pain or similar events in the past. Pt also denies any abdominal or urinary concerns, no other systemic symptoms, no focal neurologic weakness. In ED, CXR indicated R>L side consolidation, admitted for treatment of PNA, HCAP given resident of SNF.  Assessment and Plan:  Principal Problem:  *Shortness of breath - this is most likely secondary to HCAP but it is not clear if other lung process contributing such as COPD and CHF - will get CT scan for further evaluation - also ordered 2 D ECHO - will admit to telemetry bed and will provide supportive care with oxygen as needed, empiric broad spectrum abx - will also obtain sputum analysis, gram stain and culture, urine legionella and strep pneumo Active Problems:  Schizophrenia - appears stable - continue medical regimen from home  Leukocytosis - likely secondary to an infectious process, HCAP - ABX as above and CBC in AM  PNA (pneumonia) - treat as HCAP for now - CT chest for clearer evaluation  Hyponatremia - likely pre renal  - gentle hydration to avoid volume overload - 2 D  EHO  COPD (chronic obstructive pulmonary disease) - supportive care, nebs and oxygen as needed  Code Status: Full Family Communication: Pt and son at bedside Disposition Plan: Admit to telemetry bed  Review of Systems:  Constitutional: Positive for fever, chills and malaise/fatigue. Negative for diaphoresis.   HENT: Negative for hearing loss, ear pain, nosebleeds, congestion, sore throat, neck pain, tinnitus and ear discharge.   Eyes: Negative for blurred vision, double vision, photophobia, pain, discharge and redness.  Respiratory: Negative for hemoptysis, positive for sputum production, shortness of breath  Cardiovascular: Negative for chest pain, palpitations, orthopnea, claudication and leg swelling.  Gastrointestinal: Negative for nausea, vomiting and abdominal pain. Negative for heartburn, constipation, blood in stool and melena.  Genitourinary: Negative for dysuria, urgency, frequency, hematuria and flank pain.  Musculoskeletal: Negative for myalgias, back pain, joint pain and falls.  Skin: Negative for itching and rash.  Neurological: Negative for dizziness and weakness. Negative for tingling, tremors, sensory change, speech change, focal weakness, loss of consciousness and headaches.  Endo/Heme/Allergies: Negative for environmental allergies and polydipsia. Does not bruise/bleed easily.  Psychiatric/Behavioral: Negative for suicidal ideas. The patient is not nervous/anxious.      Past Medical History  Diagnosis Date  . Hypertension   . COPD (chronic obstructive pulmonary disease)   . Arthritis   . Schizophrenia   . Osteoporosis   . GERD (gastroesophageal reflux disease)   . Peripheral vascular disease   . Glaucoma(365)     History reviewed. No pertinent past surgical history.  Social History:  reports that she has been smoking.  She does not have any smokeless tobacco history on file. She reports that she does not drink alcohol or use illicit drugs.  No Known Allergies  No family history of cancers  Medication Sig  albuterol (PROVENTIL HFA;VENTOLIN HFA) 108 (90 BASE) MCG/ACT inhaler Inhale 2 puffs into the lungs every 6 (six) hours. Scheduled per nursing home mar.For wheezing  Calcium Carbonate-Vitamin D (CALCIUM 600+D) 600-400 MG-UNIT per tablet Take 1 tablet by mouth 2 (two)  times daily.  divalproex (DEPAKOTE) 250 MG DR tablet Take 250 mg by mouth every morning.   divalproex (DEPAKOTE) 500 MG DR tablet Take 500 mg by mouth at bedtime.  fluticasone (FLONASE) 50 MCG/ACT nasal spray Place 2 sprays into the nose daily. scheduled  Fluticasone-Salmeterol (ADVAIR) 250-50 MCG/DOSE AEPB Inhale 1 puff into the lungs every 12 (twelve) hours. scheduled  loratadine (CLARITIN) 10 MG tablet Take 10 mg by mouth daily. scheduled  montelukast (SINGULAIR) 10 MG tablet Take 10 mg by mouth daily.  omeprazole (PRILOSEC) 20 MG capsule Take 20 mg by mouth daily.  risperiDONE (RISPERDAL M-TABS) 2 MG disintegrating tablet Take 2 mg by mouth 2 (two) times daily.  tolterodine (DETROL LA) 4 MG 24 hr capsule Take 4 mg by mouth at bedtime.  loperamide (IMODIUM) 2 MG capsule Take 2 mg by mouth every 8 (eight) hours as needed. For loose stools  oxymetazoline (AFRIN) 0.05 % nasal spray Place 2 sprays into the nose 2 (two) times daily as needed. For nose bleed    Physical Exam: Filed Vitals:   01/23/12 0402 01/23/12 0406 01/23/12 0427  BP:  155/59 141/84  Pulse:  79 78  Temp:  101.1 F (38.4 C)   TempSrc:  Oral   Resp:  18 20  SpO2: 89% 95% 96%    Physical Exam  Constitutional: Appears well-developed and well-nourished. No distress.  HENT: Normocephalic. External right and left ear normal. Oropharynx is clear and moist.  Eyes: Conjunctivae and EOM are normal. PERRLA, no scleral icterus.  Neck: Normal ROM. Neck supple. No JVD. No tracheal deviation. No thyromegaly.  CVS: RRR, S1/S2 +, no murmurs, no gallops, no carotid bruit.  Pulmonary: Effort and breath sounds normal, no stridor, rales mostly at bases with crackles Abdominal: Soft. BS +,  no distension, tenderness, rebound or guarding.  Musculoskeletal: Normal range of motion. No edema and no tenderness.  Lymphadenopathy: No lymphadenopathy noted, cervical, inguinal. Neuro: Alert. Normal reflexes, muscle tone coordination. No cranial  nerve deficit. Skin: Skin is warm and dry. No rash noted. Not diaphoretic. No erythema. No pallor.  Psychiatric: Normal mood and affect. Behavior, judgment, thought content normal.   Labs on Admission:  Basic Metabolic Panel:  Lab 01/65/53 0459  NA 126*  K 4.6  CL 93*  CO2 24  GLUCOSE 88  BUN 10  CREATININE 0.86  CALCIUM 10.1  MG --  PHOS --   CBC:  Lab 01/23/12 0459  WBC 13.0*  NEUTROABS 9.9*  HGB 12.8  HCT 37.5  MCV 96.9  PLT 179   Cardiac Enzymes:  Lab 01/23/12 0459  CKTOTAL --  CKMB --  CKMBINDEX --  TROPONINI <0.30    Radiological Exams on Admission: Dg Chest 2 View  01/23/2012  *RADIOLOGY REPORT*  Clinical Data: Fever, weakness.  CHEST - 2 VIEW  Comparison: 07/08/2011  Findings: Right greater than left lung base opacity.  Background interstitial coarsening and hyperinflation.  Heart size upper normal to mildly enlarged.  Mediastinal contours otherwise within normal range.  No pneumothorax.  Trace right fluid not excluded. No acute osseous finding.  IMPRESSION: Right greater than left lung base consolidation, may represent pneumonia.   Original Report Authenticated By: Carlos Levering, M.D.     EKG: Normal sinus rhythm, no ST/T wave changes  Faye Ramsay, MD  [facility] Pager [phone]  If 7PM-7AM, please contact night-coverage [email or web address] Password [facility] 01/23/2012, 8:26 AM

## 2012-01-23 NOTE — ED Provider Notes (Signed)
Date: 01/23/2012  Rate: 77  Rhythm: normal sinus rhythm  QRS Axis: normal  Intervals: normal  ST/T Wave abnormalities: nonspecific ST changes in v3-5  Conduction Disutrbances: none  Narrative Interpretation:   Old EKG Reviewed: nonspecific ST changes compared to prior     Hoy Morn, MD 01/23/12 0502

## 2012-01-23 NOTE — ED Notes (Signed)
Patient returned from X-ray 

## 2012-01-23 NOTE — ED Notes (Signed)
Per EMS,  Pt. Is from [name] and was found by her Nurse  leaning on side of her bed, denied of fall, no LOC, pt. reported to be feeling weak , also reported with periods of  altered mental status. Pt. Is alert and oriented x3 upon EMS assessment.

## 2012-01-23 NOTE — ED Notes (Signed)
[id number]<VA> Expected date:<BR> Expected time:<BR> Means of arrival:<BR> Comments:<BR> EMS

## 2012-01-24 DIAGNOSIS — E871 Hypo-osmolality and hyponatremia: Secondary | ICD-10-CM

## 2012-01-24 DIAGNOSIS — I509 Heart failure, unspecified: Secondary | ICD-10-CM

## 2012-01-24 DIAGNOSIS — J189 Pneumonia, unspecified organism: Secondary | ICD-10-CM | POA: Diagnosis present

## 2012-01-24 DIAGNOSIS — F209 Schizophrenia, unspecified: Secondary | ICD-10-CM

## 2012-01-24 LAB — BASIC METABOLIC PANEL
CO2: 25 mEq/L (ref 19–32)
Chloride: 96 mEq/L (ref 96–112)
Glucose, Bld: 86 mg/dL (ref 70–99)
Potassium: 4.2 mEq/L (ref 3.5–5.1)
Sodium: 126 mEq/L — ABNORMAL LOW (ref 135–145)

## 2012-01-24 LAB — URINE CULTURE
Colony Count: NO GROWTH
Culture: NO GROWTH

## 2012-01-24 LAB — CBC
Hemoglobin: 10.7 g/dL — ABNORMAL LOW (ref 12.0–15.0)
MCH: 32.6 pg (ref 26.0–34.0)
MCV: 94.8 fL (ref 78.0–100.0)
Platelets: 187 10*3/uL (ref 150–400)
RBC: 3.28 MIL/uL — ABNORMAL LOW (ref 3.87–5.11)
WBC: 11.6 10*3/uL — ABNORMAL HIGH (ref 4.0–10.5)

## 2012-01-24 MED ORDER — SODIUM CHLORIDE 0.9 % IV SOLN
INTRAVENOUS | Status: DC
  Administered 2012-01-25 – 2012-01-26 (×3): via INTRAVENOUS

## 2012-01-24 MED ORDER — ENOXAPARIN SODIUM 40 MG/0.4ML ~~LOC~~ SOLN
40.0000 mg | SUBCUTANEOUS | Status: DC
  Administered 2012-01-24 – 2012-01-26 (×3): 40 mg via SUBCUTANEOUS
  Filled 2012-01-24 (×3): qty 0.4

## 2012-01-24 MED ORDER — ACETAMINOPHEN 325 MG PO TABS
650.0000 mg | ORAL_TABLET | Freq: Four times a day (QID) | ORAL | Status: DC | PRN
  Administered 2012-01-26: 650 mg via ORAL
  Filled 2012-01-24: qty 2

## 2012-01-24 NOTE — Plan of Care (Signed)
Problem: Phase I Progression Outcomes Goal: First antibiotic given within 6hrs of admit Outcome: Completed/Met Date Met:  01/23/12 Received Zithromax and Rocephin in ED

## 2012-01-24 NOTE — Progress Notes (Signed)
Patients fingers noted to be very cold when attempting to obtain oxygen saturation.  Patient had taken O2 off stating it was making it hard for her to breathe because her nose was stopped up.  With oxygen probe on forehead O2 sat 100% on room air.  Will leave O2 off at this time and continue to monitor sats.  Patient in no respiratory distress and says she feels better without the oxygen.

## 2012-01-24 NOTE — Progress Notes (Signed)
[facility] PROGRESS NOTE  Miranda Ballard YTK:[id number] DOB: September 29, 1940 DOA: 01/23/2012  PCP: Rachell Cipro, MD  Brief HPI: Pt is 71 yo female, resident of [facility], who presented to [facility] ED with main concern of progressively worsening shortness of breath that initially started several days prior to admission, associated with productive cough of yellow sputum, subjective fevers chills, intermittent changes in mental status, poor oral intake. Pt denied chest pain or similar events in the past. Pt also denied any abdominal or urinary concerns, no other systemic symptoms, no focal neurologic weakness. In ED, CXR indicated R>L side consolidation, admitted for treatment of PNA, HCAP given resident of SNF.   Past medical history:  Past Medical History  Diagnosis Date  . Hypertension   . COPD (chronic obstructive pulmonary disease)   . Arthritis   . Schizophrenia   . Osteoporosis   . GERD (gastroesophageal reflux disease)   . Peripheral vascular disease   . Glaucoma(365)     Consultants: None  Procedures: None  Antibiotics: IV Levaquin 12/21--> IV Vanc 12/21--> IV Cefepime 12/21-->  Subjective: Patient feels well. Complains of soreness along ribs on both sides. Has been coughing a lot. No other complaints.  Objective: Vital Signs  Filed Vitals:   01/23/12 2139 01/24/12 0139 01/24/12 0458 01/24/12 0912  BP: 102/70  100/68   Pulse: 64  69   Temp: 99 F (37.2 C)  98.8 F (37.1 C)   TempSrc: Oral  Oral   Resp: 20  18   Height:      Weight:      SpO2: 94% 93% 94% 94%    Intake/Output Summary (Last 24 hours) at 01/24/12 1116 Last data filed at 01/23/12 2100  Gross per 24 hour  Intake    120 ml  Output      0 ml  Net    120 ml   Filed Weights   01/23/12 1100  Weight: 59.92 kg (132 lb 1.6 oz)    Intake/Output from previous day: 12/21 0701 - 12/22 0700 In: 120 [P.O.:120] Out: -   General appearance: alert, cooperative, appears stated age and no  distress Head: Normocephalic, without obvious abnormality, atraumatic Resp: Rhonchi bilaterally. Few wheezes. Few crackles bilateral bases. Cardio: regular rate and rhythm, S1, S2 normal, no murmur, click, rub or gallop GI: soft, non-tender; bowel sounds normal; no masses,  no organomegaly Extremities: extremities normal, atraumatic, no cyanosis or edema Pulses: 2+ and symmetric Skin: Skin color, texture, turgor normal. No rashes or lesions Neurologic: Alert and oriented x 3. No focal deficits.  Lab Results:  Basic Metabolic Panel:  Lab 55/73/22 0523 01/23/12 0459  NA 126* 126*  K 4.2 4.6  CL 96 93*  CO2 25 24  GLUCOSE 86 88  BUN 10 10  CREATININE 0.86 0.86  CALCIUM 8.6 10.1  MG -- --  PHOS -- --   Liver Function Tests: No results found for this basename: AST:5,ALT:5,ALKPHOS:5,BILITOT:5,PROT:5,ALBUMIN:5 in the last 168 hours No results found for this basename: LIPASE:5,AMYLASE:5 in the last 168 hours No results found for this basename: AMMONIA:5 in the last 168 hours CBC:  Lab 01/24/12 0523 01/23/12 0459  WBC 11.6* 13.0*  NEUTROABS -- 9.9*  HGB 10.7* 12.8  HCT 31.1* 37.5  MCV 94.8 96.9  PLT 187 179   Cardiac Enzymes:  Lab 01/23/12 0459  CKTOTAL --  CKMB --  CKMBINDEX --  TROPONINI <0.30    Recent Results (from the past 240 hour(s))  URINE CULTURE     Status:  Normal   Collection Time   01/23/12  5:55 AM      Component Value Range Status Comment   Specimen Description URINE, CLEAN CATCH   Final    Special Requests NONE   Final    Culture  Setup Time 01/23/2012 13:25   Final    Colony Count NO GROWTH   Final    Culture NO GROWTH   Final    Report Status 01/24/2012 FINAL   Final       Studies/Results: Dg Chest 2 View  01/23/2012  *RADIOLOGY REPORT*  Clinical Data: Fever, weakness.  CHEST - 2 VIEW  Comparison: 07/08/2011  Findings: Right greater than left lung base opacity.  Background interstitial coarsening and hyperinflation.  Heart size upper normal to  mildly enlarged.  Mediastinal contours otherwise within normal range.  No pneumothorax.  Trace right fluid not excluded. No acute osseous finding.  IMPRESSION: Right greater than left lung base consolidation, may represent pneumonia.   Original Report Authenticated By: Carlos Levering, M.D.    Ct Chest Wo Contrast  01/23/2012  *RADIOLOGY REPORT*  Clinical Data: Progressively worsening shortness of breath initially starting several days ago.  Productive cough.  Subjective fever and chills.  CT CHEST WITHOUT CONTRAST  Technique:  Multidetector CT imaging of the chest was performed following the standard protocol without IV contrast.  Comparison: Chest radiograph, 01/23/2012  Findings: There is bronchial wall thickening with evidence of mucus plugging in the lower lobes.  There is consolidation in both posterior lower lobes, greater on the right.  A minimal amount of right pleural fluid is present.  Mucous/secretions mostly opacifies the right lower lobe bronchus.  There are milder degrees of bronchial wall thickening extending into the upper lobes and right middle lobe.  The lungs also show areas of peripheral reticular opacity most suggestive of interstitial scarring.  Mild pleural parenchymal scarring is noted at the apices.  No pulmonary masses or nodules.  The heart is normal in size.  No mediastinal or hilar masses or adenopathy noted.  Limited evaluation of the upper abdomen is unremarkable.  The bony thorax is unremarkable.  IMPRESSION: Bronchial wall thickening with significant bronchial mucus/secretions mostly in the lower lobes associated with areas of dependent lung consolidation.  The findings are consistent with both bronchitis and areas of lobar pneumonia.  No other areas of bronchial wall thickening may also be due to acute bronchitis, but can be chronic.  There is mild chronic interstitial thickening.  Minimal right pleural effusion.   Original Report Authenticated By: Lajean Manes, M.D.      Medications:  Scheduled:    . albuterol  2 puff Inhalation Q6H  . ceFEPime (MAXIPIME) IV  1 g Intravenous Q8H  . divalproex  250 mg Oral q morning - 10a  . divalproex  500 mg Oral QHS  . enoxaparin  40 mg Subcutaneous Q24H  . fesoterodine  8 mg Oral Daily  . fluticasone  2 spray Each Nare Daily  . levofloxacin (LEVAQUIN) IV  750 mg Intravenous Q24H  . loratadine  10 mg Oral Daily  . mometasone-formoterol  2 puff Inhalation BID  . montelukast  10 mg Oral Daily  . pantoprazole  40 mg Oral Daily  . risperiDONE  2 mg Oral BID  . vancomycin  500 mg Intravenous Q12H   Continuous:  [id number], oxymetazoline  Assessment/Plan:  Principal Problem:  *HCAP (healthcare-associated pneumonia) Active Problems:  Schizophrenia  COPD (chronic obstructive pulmonary disease)  Shortness of breath  Leukocytosis  Hyponatremia    HCAP  Continue broad spectrum antibiotics. Continue O2 and Inhaler. Await culture results.  History of Schizophrenia  Appears stable. Ccontinue medical regimen from home   Hyponatremia  Likely from dehydration. No change today. Gentle NS infusion. No point in getting urine osm now as results will not be accurate.   COPD (chronic obstructive pulmonary disease)  Supportive care, nebs and oxygen.   DVT Prophylaxis Enoxaparin  Code Status: Full  Family Communication: Discussed with patient. Disposition Plan: Will return to SNF when improved.   LOS: 1 day   [facility] Pager [phone] 01/24/2012, 11:16 AM  If 8PM-8AM, please contact night-coverage at [email or web address], password [name]

## 2012-01-24 NOTE — Progress Notes (Signed)
  Echocardiogram 2D Echocardiogram has been performed.  Alvin Critchley 01/24/2012, 10:12 AM

## 2012-01-25 LAB — BASIC METABOLIC PANEL
BUN: 7 mg/dL (ref 6–23)
Chloride: 100 mEq/L (ref 96–112)
Creatinine, Ser: 0.74 mg/dL (ref 0.50–1.10)
GFR calc Af Amer: >90 mL/min (ref >90)
Glucose, Bld: 98 mg/dL (ref 70–99)
Potassium: 3.6 mEq/L (ref 3.5–5.1)

## 2012-01-25 LAB — CBC
HCT: 31.2 % — ABNORMAL LOW (ref 36.0–46.0)
Hemoglobin: 10.5 g/dL — ABNORMAL LOW (ref 12.0–15.0)
MCHC: 33.7 g/dL (ref 30.0–36.0)
MCV: 96.3 fL (ref 78.0–100.0)
RDW: 13.1 % (ref 11.5–15.5)
WBC: 11.3 10*3/uL — ABNORMAL HIGH (ref 4.0–10.5)

## 2012-01-25 LAB — VANCOMYCIN, TROUGH: Vancomycin Tr: 6.2 ug/mL — ABNORMAL LOW (ref 10.0–20.0)

## 2012-01-25 MED ORDER — VANCOMYCIN HCL 1000 MG IV SOLR
750.0000 mg | Freq: Two times a day (BID) | INTRAVENOUS | Status: DC
  Administered 2012-01-25 – 2012-01-26 (×3): 750 mg via INTRAVENOUS
  Filled 2012-01-25 (×4): qty 750

## 2012-01-25 MED ORDER — DOCUSATE SODIUM 100 MG PO CAPS
100.0000 mg | ORAL_CAPSULE | Freq: Two times a day (BID) | ORAL | Status: DC
  Administered 2012-01-25 – 2012-01-26 (×2): 100 mg via ORAL
  Filled 2012-01-25 (×3): qty 1

## 2012-01-25 MED ORDER — POLYETHYLENE GLYCOL 3350 17 G PO PACK
17.0000 g | PACK | Freq: Every day | ORAL | Status: DC
  Administered 2012-01-25 – 2012-01-26 (×2): 17 g via ORAL
  Filled 2012-01-25 (×2): qty 1

## 2012-01-25 NOTE — Progress Notes (Signed)
[facility] PROGRESS NOTE  Nickayla Mcinnis GBE:[id number] DOB: 31-Jan-1941 DOA: 01/23/2012  PCP: Rachell Cipro, MD  Brief HPI: Pt is 71 yo female, resident of [facility], who presented to [facility] ED with main concern of progressively worsening shortness of breath that initially started several days prior to admission, associated with productive cough of yellow sputum, subjective fevers chills, intermittent changes in mental status, poor oral intake. Pt denied chest pain or similar events in the past. Pt also denied any abdominal or urinary concerns, no other systemic symptoms, no focal neurologic weakness. In ED, CXR indicated R>L side consolidation, admitted for treatment of PNA, HCAP given resident of SNF.   Past medical history:  Past Medical History  Diagnosis Date  . Hypertension   . COPD (chronic obstructive pulmonary disease)   . Arthritis   . Schizophrenia   . Osteoporosis   . GERD (gastroesophageal reflux disease)   . Peripheral vascular disease   . Glaucoma(365)     Consultants: None  Procedures: None  Antibiotics: IV Levaquin 12/21--> IV Vanc 12/21--> IV Cefepime 12/21-->  Subjective: Patient continues to feel better. Cough is better. Complains of soreness along ribs on both sides.   Objective: Vital Signs  Filed Vitals:   01/24/12 2221 01/25/12 0236 01/25/12 0511 01/25/12 0818  BP: 128/76  129/82   Pulse: 94  64   Temp: 99.4 F (37.4 C)  98.8 F (37.1 C)   TempSrc: Oral  Oral   Resp: 18  18   Height:      Weight:      SpO2: 98% 91% 92% 93%    Intake/Output Summary (Last 24 hours) at 01/25/12 1115 Last data filed at 01/25/12 7588  Gross per 24 hour  Intake   1440 ml  Output      0 ml  Net   1440 ml   Filed Weights   01/23/12 1100  Weight: 59.92 kg (132 lb 1.6 oz)    Intake/Output from previous day: 12/22 0701 - 12/23 0700 In: 1200 [I.V.:1200] Out: -   General appearance: alert, cooperative, appears stated age and no distress Head:  Normocephalic, without obvious abnormality, atraumatic Resp: Improved air entry bilaterally. No wheezing. Few crackles bilateral bases. Cardio: regular rate and rhythm, S1, S2 normal, no murmur, click, rub or gallop GI: soft, non-tender; bowel sounds normal; no masses,  no organomegaly Extremities: extremities normal, atraumatic, no cyanosis or edema Pulses: 2+ and symmetric Skin: Skin color, texture, turgor normal. No rashes or lesions Neurologic: Alert and oriented x 3. No focal deficits.  Lab Results:  Basic Metabolic Panel:  Lab 32/54/98 0450 01/24/12 0523 01/23/12 0459  NA 131* 126* 126*  K 3.6 4.2 4.6  CL 100 96 93*  CO2 _0 GLUCOSE 98 86 88  BUN _1 CREATININE 0.74 0.86 0.86  CALCIUM 8.6 8.6 10.1  MG -- -- --  PHOS -- -- --   CBC:  Lab 01/25/12 0450 01/24/12 0523 01/23/12 0459  WBC 11.3* 11.6* 13.0*  NEUTROABS -- -- 9.9*  HGB 10.5* 10.7* 12.8  HCT 31.2* 31.1* 37.5  MCV 96.3 94.8 96.9  PLT 171 187 179   Cardiac Enzymes:  Lab 01/23/12 0459  CKTOTAL --  CKMB --  CKMBINDEX --  TROPONINI <0.30    Recent Results (from the past 240 hour(s))  CULTURE, BLOOD (ROUTINE X 2)     Status: Normal (Preliminary result)   Collection Time   01/23/12  4:46 AM  Component Value Range Status Comment   Specimen Description BLOOD UNKOWN   Final    Special Requests BOTTLES DRAWN AEROBIC AND ANAEROBIC 5CC EACH   Final    Culture  Setup Time 01/23/2012 13:31   Final    Culture     Final    Value:        BLOOD CULTURE RECEIVED NO GROWTH TO DATE CULTURE WILL BE HELD FOR 5 DAYS BEFORE ISSUING A FINAL NEGATIVE REPORT   Report Status PENDING   Incomplete   CULTURE, BLOOD (ROUTINE X 2)     Status: Normal (Preliminary result)   Collection Time   01/23/12  4:53 AM      Component Value Range Status Comment   Specimen Description BLOOD UNKNOWN   Final    Special Requests BOTTLES DRAWN AEROBIC AND ANAEROBIC 5CC EACH   Final    Culture  Setup Time 01/23/2012 13:31   Final     Culture     Final    Value:        BLOOD CULTURE RECEIVED NO GROWTH TO DATE CULTURE WILL BE HELD FOR 5 DAYS BEFORE ISSUING A FINAL NEGATIVE REPORT   Report Status PENDING   Incomplete   URINE CULTURE     Status: Normal   Collection Time   01/23/12  5:55 AM      Component Value Range Status Comment   Specimen Description URINE, CLEAN CATCH   Final    Special Requests NONE   Final    Culture  Setup Time 01/23/2012 13:25   Final    Colony Count NO GROWTH   Final    Culture NO GROWTH   Final    Report Status 01/24/2012 FINAL   Final       Studies/Results: Ct Chest Wo Contrast  01/23/2012  *RADIOLOGY REPORT*  Clinical Data: Progressively worsening shortness of breath initially starting several days ago.  Productive cough.  Subjective fever and chills.  CT CHEST WITHOUT CONTRAST  Technique:  Multidetector CT imaging of the chest was performed following the standard protocol without IV contrast.  Comparison: Chest radiograph, 01/23/2012  Findings: There is bronchial wall thickening with evidence of mucus plugging in the lower lobes.  There is consolidation in both posterior lower lobes, greater on the right.  A minimal amount of right pleural fluid is present.  Mucous/secretions mostly opacifies the right lower lobe bronchus.  There are milder degrees of bronchial wall thickening extending into the upper lobes and right middle lobe.  The lungs also show areas of peripheral reticular opacity most suggestive of interstitial scarring.  Mild pleural parenchymal scarring is noted at the apices.  No pulmonary masses or nodules.  The heart is normal in size.  No mediastinal or hilar masses or adenopathy noted.  Limited evaluation of the upper abdomen is unremarkable.  The bony thorax is unremarkable.  IMPRESSION: Bronchial wall thickening with significant bronchial mucus/secretions mostly in the lower lobes associated with areas of dependent lung consolidation.  The findings are consistent with both  bronchitis and areas of lobar pneumonia.  No other areas of bronchial wall thickening may also be due to acute bronchitis, but can be chronic.  There is mild chronic interstitial thickening.  Minimal right pleural effusion.   Original Report Authenticated By: Lajean Manes, M.D.     Medications:  Scheduled:    . albuterol  2 puff Inhalation Q6H  . ceFEPime (MAXIPIME) IV  1 g Intravenous Q8H  . divalproex  250 mg  Oral q morning - 10a  . divalproex  500 mg Oral QHS  . enoxaparin  40 mg Subcutaneous Q24H  . fesoterodine  8 mg Oral Daily  . fluticasone  2 spray Each Nare Daily  . levofloxacin (LEVAQUIN) IV  750 mg Intravenous Q24H  . loratadine  10 mg Oral Daily  . mometasone-formoterol  2 puff Inhalation BID  . montelukast  10 mg Oral Daily  . pantoprazole  40 mg Oral Daily  . risperiDONE  2 mg Oral BID  . vancomycin  500 mg Intravenous Q12H   Continuous:    . sodium chloride 100 mL/hr at 01/25/12 4536   IWO:[id number], loperamide, oxymetazoline  Assessment/Plan:  Principal Problem:  *HCAP (healthcare-associated pneumonia) Active Problems:  Schizophrenia  COPD (chronic obstructive pulmonary disease)  Shortness of breath  Leukocytosis  Hyponatremia    HCAP  Improving. Continue broad spectrum antibiotics. Continue O2 and Inhaler. Await culture results.  History of Schizophrenia  Appears stable. Continue medical regimen from home   Hyponatremia  Likely from dehydration. Improved. Continue NS infusion.  COPD (chronic obstructive pulmonary disease)  Supportive care, nebs and oxygen.   DVT Prophylaxis Enoxaparin  Code Status: Full  Family Communication: Discussed with patient and her sister. Disposition Plan: Will return to SNF when improved. Anticipate discharge in AM.   LOS: 2 days   [facility] Pager [phone] 01/25/2012, 11:15 AM  If 8PM-8AM, please contact night-coverage at [email or web address], password [facility]

## 2012-01-25 NOTE — Progress Notes (Signed)
Clinical Social Work Department BRIEF PSYCHOSOCIAL ASSESSMENT 01/25/2012  Patient:  Ballard,Miranda     Account Number:  [id number]     Admit date:  01/23/2012  Clinical Social Worker:  Levie Heritage  Date/Time:  01/25/2012 01:57 PM  Referred by:  Physician  Date Referred:  01/25/2012 Referred for  ALF Placement   Other Referral:   Interview type:  Patient Other interview type:   Son, [place].    PSYCHOSOCIAL DATA Living Status:  FACILITY Admitted from facility:  [facility] Level of care:  Assisted Living Primary support name:  Carmelia Roller Primary support relationship to patient:  CHILD, ADULT Degree of support available:   adequate    CURRENT CONCERNS Current Concerns  Post-Acute Placement   Other Concerns:    SOCIAL WORK ASSESSMENT / PLAN Met with Pt to discuss d/c plans.    Pt confirmed that she's from [facility] but stated that her son is looking to send her to another facility. Pt asked that CSW contact her son to discuss this further.    CSW thanked Pt for her time.    Spoke with Pt's son, Mr. Miranda Ballard.  Mr. Miranda Ballard stated, "It's a complicated situation."  Mr. Miranda Ballard explained that he intends to take Pt home tomorrow but that he'd like for CSW to operate under the assumption that she'll be going back to [facility] tomorrow.  He stated that he might try to find another facility for Pt.    CSW thanked Mr. Miranda Ballard for his time.    Spoke with Charlena Cross at [facility].  Charlena Cross stated that she heard that Pt may not be returning but could not provide CSW with any further information.    Ebony to call CSW back with more information.    Call back from [facility].    Charlena Cross stated that Pt's son provided the facility with a 2-week notice.  He intends to take her home for the holidays and then send her back to the facility for the duration of the notice while he explores other placement options.    Charlena Cross thinks that the facility will need to assess Pt  for appropriateness of return and she will call CSW back to confirm if this needs to happen.    Ebony stating that the facility will need and FL2 and d/c summary, if Pt is to return there, eventually, after d/c.    CSW thanked [facility] for her time.   Assessment/plan status:  Psychosocial Support/Ongoing Assessment of Needs Other assessment/ plan:   Information/referral to community resources:   n/a    PATIENT'S/FAMILY'S RESPONSE TO PLAN OF CARE: Pt and son thanked CSW for time and assistance.   CSW to continue to follow.  Bernita Raisin, [place] Work [phone]

## 2012-01-25 NOTE — Progress Notes (Signed)
ANTIBIOTIC CONSULT NOTE  Pharmacy Consult for Vancomycin/renal adjustment of other antibiotics Indication: pneumonia  No Known Allergies  Patient Measurements: Height: _0  (165.1 cm) Weight: 132 lb 1.6 oz (59.92 kg) IBW/kg (Calculated) : 57  Weight from July 2013: 54.4 kg - nursing to update weight and height and enter in EPIC  Vital Signs: Temp: 98.8 F (37.1 C) (12/23 0511) Temp src: Oral (12/23 0511) BP: 129/82 mmHg (12/23 0511) Pulse Rate: 64  (12/23 0511) Intake/Output from previous day: 12/22 0701 - 12/23 0700 In: 1200 [I.V.:1200] Out: -  Intake/Output from this shift: Total I/O In: 240 [P.O.:240] Out: -   Labs:  Basename 01/25/12 0450 01/24/12 0523 01/23/12 0459  WBC 11.3* 11.6* 13.0*  HGB 10.5* 10.7* 12.8  PLT 171 187 179  LABCREA -- -- --  CREATININE 0.74 0.86 0.86   Estimated Creatinine Clearance: 58.9 ml/min (by C-G formula based on Cr of 0.74).  Basename 01/25/12 1050  VANCOTROUGH 6.2*  VANCOPEAK --  Jake Michaelis --  GENTTROUGH --  GENTPEAK --  GENTRANDOM --  TOBRATROUGH --  TOBRAPEAK --  TOBRARND --  AMIKACINPEAK --  AMIKACINTROU --  AMIKACIN --     CrCl CG (using weight of 54kg from July 2013): 52 ml/min CrCl N: 69 ml/min  Microbiology: Recent Results (from the past 720 hour(s))  CULTURE, BLOOD (ROUTINE X 2)     Status: Normal (Preliminary result)   Collection Time   01/23/12  4:46 AM      Component Value Range Status Comment   Specimen Description BLOOD UNKOWN   Final    Special Requests BOTTLES DRAWN AEROBIC AND ANAEROBIC 5CC EACH   Final    Culture  Setup Time 01/23/2012 13:31   Final    Culture     Final    Value:        BLOOD CULTURE RECEIVED NO GROWTH TO DATE CULTURE WILL BE HELD FOR 5 DAYS BEFORE ISSUING A FINAL NEGATIVE REPORT   Report Status PENDING   Incomplete   CULTURE, BLOOD (ROUTINE X 2)     Status: Normal (Preliminary result)   Collection Time   01/23/12  4:53 AM      Component Value Range Status Comment   Specimen  Description BLOOD UNKNOWN   Final    Special Requests BOTTLES DRAWN AEROBIC AND ANAEROBIC 5CC EACH   Final    Culture  Setup Time 01/23/2012 13:31   Final    Culture     Final    Value:        BLOOD CULTURE RECEIVED NO GROWTH TO DATE CULTURE WILL BE HELD FOR 5 DAYS BEFORE ISSUING A FINAL NEGATIVE REPORT   Report Status PENDING   Incomplete   URINE CULTURE     Status: Normal   Collection Time   01/23/12  5:55 AM      Component Value Range Status Comment   Specimen Description URINE, CLEAN CATCH   Final    Special Requests NONE   Final    Culture  Setup Time 01/23/2012 13:25   Final    Colony Count NO GROWTH   Final    Culture NO GROWTH   Final    Report Status 01/24/2012 FINAL   Final     Medical History: Past Medical History  Diagnosis Date  . Hypertension   . COPD (chronic obstructive pulmonary disease)   . Arthritis   . Schizophrenia   . Osteoporosis   . GERD (gastroesophageal reflux disease)   . Peripheral  vascular disease   . Glaucoma(365)     Medications:  Scheduled:     . albuterol  2 puff Inhalation Q6H  . ceFEPime (MAXIPIME) IV  1 g Intravenous Q8H  . divalproex  250 mg Oral q morning - 10a  . divalproex  500 mg Oral QHS  . enoxaparin  40 mg Subcutaneous Q24H  . fesoterodine  8 mg Oral Daily  . fluticasone  2 spray Each Nare Daily  . levofloxacin (LEVAQUIN) IV  750 mg Intravenous Q24H  . loratadine  10 mg Oral Daily  . mometasone-formoterol  2 puff Inhalation BID  . montelukast  10 mg Oral Daily  . pantoprazole  40 mg Oral Daily  . risperiDONE  2 mg Oral BID  . vancomycin  500 mg Intravenous Q12H   Anti-infectives     Start     Dose/Rate Route Frequency Ordered Stop   01/23/12 1400   ceFEPIme (MAXIPIME) 1 g in dextrose 5 % 50 mL IVPB        1 g 100 mL/hr over 30 Minutes Intravenous 3 times per day 01/23/12 1131 01/31/12 1359   01/23/12 1200   vancomycin (VANCOCIN) 500 mg in sodium chloride 0.9 % 100 mL IVPB        500 mg 100 mL/hr over 60 Minutes  Intravenous Every 12 hours 01/23/12 1157     01/23/12 1145   levofloxacin (LEVAQUIN) IVPB 750 mg        750 mg 100 mL/hr over 90 Minutes Intravenous Every 24 hours 01/23/12 1131 01/30/12 1112   01/23/12 1100   cefTRIAXone (ROCEPHIN) 1 g in dextrose 5 % 50 mL IVPB  Status:  Discontinued        1 g 100 mL/hr over 30 Minutes Intravenous Every 24 hours 01/23/12 1028 01/23/12 1131   01/23/12 1100   azithromycin (ZITHROMAX) tablet 500 mg  Status:  Discontinued        500 mg Oral Every 24 hours 01/23/12 1028 01/23/12 1131   01/23/12 0730   cefTRIAXone (ROCEPHIN) 1 g in dextrose 5 % 50 mL IVPB        1 g 100 mL/hr over 30 Minutes Intravenous  Once 01/23/12 0719 01/23/12 0840   01/23/12 0730   azithromycin (ZITHROMAX) 500 mg in dextrose 5 % 250 mL IVPB        500 mg 250 mL/hr over 60 Minutes Intravenous  Once 01/23/12 0719 01/23/12 0950         Assessment:  71 YO F on D#3 vancomycin 567m IV q12h / cefepime 1g IV q8h / levofloxacin 7535mIV q24h for HCAP.  SCr WNL, stable.  Vancomycin trough subtherapeutic.  Fever resolved, leukocytosis improving.   Goal of Therapy:  Vancomycin trough level 15-20 mcg/ml Adjust other antibiotic dosages for renal function Eradication of infection  Plan:  1. Increase vancomycin to 750 mg IV q12h 2. Continue cefepime 1 g IV q8h 3. Continue levofloxacin 750 mg IV q24h    RaClayburn PertPharmD, BCPS Pager: [phone]/23/2013  11:45 AM

## 2012-01-26 LAB — BASIC METABOLIC PANEL
Calcium: 8.9 mg/dL (ref 8.4–10.5)
Chloride: 103 mEq/L (ref 96–112)
Creatinine, Ser: 0.84 mg/dL (ref 0.50–1.10)
GFR calc Af Amer: 80 mL/min — ABNORMAL LOW (ref >90)
Sodium: 132 mEq/L — ABNORMAL LOW (ref 135–145)

## 2012-01-26 LAB — CBC
HCT: 32.3 % — ABNORMAL LOW (ref 36.0–46.0)
Platelets: 177 10*3/uL (ref 150–400)
RBC: 3.39 MIL/uL — ABNORMAL LOW (ref 3.87–5.11)
RDW: 13.1 % (ref 11.5–15.5)
WBC: 6.4 10*3/uL (ref 4.0–10.5)

## 2012-01-26 MED ORDER — DIVALPROEX SODIUM 500 MG PO DR TAB: 500.0000 mg | DELAYED_RELEASE_TABLET | Freq: Every day | ORAL | Status: DC

## 2012-01-26 MED ORDER — MONTELUKAST SODIUM 10 MG PO TABS: 10.0000 mg | ORAL_TABLET | Freq: Every day | ORAL | Status: AC

## 2012-01-26 MED ORDER — FLUTICASONE PROPIONATE 50 MCG/ACT NA SUSP: 2.0000 | Freq: Every day | NASAL | Status: AC

## 2012-01-26 MED ORDER — CALCIUM CARBONATE-VITAMIN D 600-400 MG-UNIT PO TABS: 1.0000 | ORAL_TABLET | Freq: Two times a day (BID) | ORAL | Status: DC

## 2012-01-26 MED ORDER — ALBUTEROL SULFATE HFA 108 (90 BASE) MCG/ACT IN AERS
2.0000 | INHALATION_SPRAY | Freq: Four times a day (QID) | RESPIRATORY_TRACT | Status: DC
  Administered 2012-01-26: 2 via RESPIRATORY_TRACT

## 2012-01-26 MED ORDER — RISPERIDONE 2 MG PO TBDP: 2.0000 mg | ORAL_TABLET | Freq: Two times a day (BID) | ORAL | Status: DC

## 2012-01-26 MED ORDER — FLUTICASONE-SALMETEROL 250-50 MCG/DOSE IN AEPB: 1.0000 | INHALATION_SPRAY | Freq: Two times a day (BID) | RESPIRATORY_TRACT | Status: DC

## 2012-01-26 MED ORDER — ALBUTEROL SULFATE HFA 108 (90 BASE) MCG/ACT IN AERS: 2.0000 | INHALATION_SPRAY | Freq: Four times a day (QID) | RESPIRATORY_TRACT | Status: AC

## 2012-01-26 MED ORDER — DIVALPROEX SODIUM 250 MG PO DR TAB: 250.0000 mg | DELAYED_RELEASE_TABLET | Freq: Every morning | ORAL | Status: DC

## 2012-01-26 MED ORDER — LORATADINE 10 MG PO TABS: 10.0000 mg | ORAL_TABLET | Freq: Every day | ORAL | Status: DC

## 2012-01-26 MED ORDER — DSS 100 MG PO CAPS: 100.0000 mg | ORAL_CAPSULE | Freq: Two times a day (BID) | ORAL | Status: AC

## 2012-01-26 MED ORDER — OMEPRAZOLE 20 MG PO CPDR: 20.0000 mg | DELAYED_RELEASE_CAPSULE | Freq: Every day | ORAL | Status: DC

## 2012-01-26 MED ORDER — LEVOFLOXACIN 500 MG PO TABS: 500.0000 mg | ORAL_TABLET | Freq: Every day | ORAL | Status: DC

## 2012-01-26 MED ORDER — TOLTERODINE TARTRATE ER 4 MG PO CP24: 4.0000 mg | ORAL_CAPSULE | Freq: Every day | ORAL | Status: DC

## 2012-01-26 NOTE — Progress Notes (Signed)
Patient discharged home in stable condition with son.  Instructions and scripts given to son (POA) with verbal feedback and understanding.  MyChart account set up prior to discharge

## 2012-01-26 NOTE — Discharge Summary (Signed)
[facility]  Physician Discharge Summary   Patient ID: Miranda Ballard MRN: [id number] DOB/AGE: [birth year] 71 y.o.  Admit date: 01/23/2012 Discharge date: 01/26/2012  PCP: Rachell Cipro, MD  DISCHARGE DIAGNOSES:  Principal Problem:  *HCAP (healthcare-associated pneumonia) Active Problems:  Schizophrenia  COPD (chronic obstructive pulmonary disease)  Shortness of breath  Leukocytosis  Hyponatremia   RECOMMENDATIONS FOR OUTPATIENT FOLLOW UP: 1. Needs early follow up with PCP  DISCHARGE CONDITION: fair  Diet recommendation: Low Sodium  Filed Weights   01/23/12 1100  Weight: 59.92 kg (132 lb 1.6 oz)    INITIAL HISTORY: Pt is 71 yo female, resident of [facility], who presented to [facility] ED with main concern of progressively worsening shortness of breath that initially started several days prior to admission, associated with productive cough of yellow sputum, subjective fevers chills, intermittent changes in mental status, poor oral intake. Pt denied chest pain or similar events in the past. Pt also denied any abdominal or urinary concerns, no other systemic symptoms, no focal neurologic weakness.   Consultations:  None  Procedures: 2D ECHOCARDIOGRAM  Study Conclusions - Left ventricle: The cavity size was normal. Wall thickness was normal. The estimated ejection fraction was 65%. Wall motion was normal; there were no regional wall motion abnormalities. - Impressions: CVP estimate is 69mHg. This suggests that PA pressure is 3102mg. Data suggests probable diastolic dysfunction.   HOSPITAL COURSE:   Health Care Associated Pneumonia  Patient was admitted with pneumonia. This was felt to be HCAP considering she lives in ALF. She was started on broad spectrum antibiotics. Her cultures are all negative. She has improved. She will be changed over to oral Levofloxacin. She is saturating 92% on room air and is afebrile. Her WBC has improved as well. She will be seen by  PT/OT prior to discharge. I was informed by the CSW that her son wants to take her home instead of ALF.   History of Schizophrenia  Appears stable. Continue current medical regimen.   Hyponatremia  Likely from dehydration. Improved with NS.   COPD (chronic obstructive pulmonary disease)  Stable. Continue home meds.   If cleared by PT/OT she can be discharged home with son. She is stable.   PERTINENT LABS:  The results of significant diagnostics from this hospitalization (including imaging, microbiology, ancillary and laboratory) are listed below for reference.    Microbiology: Recent Results (from the past 240 hour(s))  CULTURE, BLOOD (ROUTINE X 2)     Status: Normal (Preliminary result)   Collection Time   01/23/12  4:46 AM      Component Value Range Status Comment   Specimen Description BLOOD UNKOWN   Final    Special Requests BOTTLES DRAWN AEROBIC AND ANAEROBIC 5CC EACH   Final    Culture  Setup Time 01/23/2012 13:31   Final    Culture     Final    Value:        BLOOD CULTURE RECEIVED NO GROWTH TO DATE CULTURE WILL BE HELD FOR 5 DAYS BEFORE ISSUING A FINAL NEGATIVE REPORT   Report Status PENDING   Incomplete   CULTURE, BLOOD (ROUTINE X 2)     Status: Normal (Preliminary result)   Collection Time   01/23/12  4:53 AM      Component Value Range Status Comment   Specimen Description BLOOD UNKNOWN   Final    Special Requests BOTTLES DRAWN [facility]EROBIC AND [facility]OBIC [facility]   Final    Culture  Setup Time 01/23/2012 13:31  Final    Culture     Final    Value:        BLOOD CULTURE RECEIVED NO GROWTH TO DATE CULTURE WILL BE HELD FOR 5 DAYS BEFORE ISSUING A FINAL NEGATIVE REPORT   Report Status PENDING   Incomplete   URINE CULTURE     Status: Normal   Collection Time   01/23/12  5:55 AM      Component Value Range Status Comment   Specimen Description URINE, CLEAN CATCH   Final    Special Requests NONE   Final    Culture  Setup Time 01/23/2012 13:25   Final    Colony Count NO  GROWTH   Final    Culture NO GROWTH   Final    Report Status 01/24/2012 FINAL   Final      Labs: Basic Metabolic Panel:  Lab 01/65/53 0527 01/25/12 0450 01/24/12 0523 01/23/12 0459  NA 132* 131* 126* 126*  K 3.8 3.6 4.2 4.6  CL 103 100 96 93*  CO2 _0 GLUCOSE 96 98 86 88  BUN _1 CREATININE 0.84 0.74 0.86 0.86  CALCIUM 8.9 8.6 8.6 10.1  MG -- -- -- --  PHOS -- -- -- --   CBC:  Lab 01/26/12 0527 01/25/12 0450 01/24/12 0523 01/23/12 0459  WBC 6.4 11.3* 11.6* 13.0*  NEUTROABS -- -- -- 9.9*  HGB 11.1* 10.5* 10.7* 12.8  HCT 32.3* 31.2* 31.1* 37.5  MCV 95.3 96.3 94.8 96.9  PLT 177 171 187 179   Cardiac Enzymes:  Lab 01/23/12 0459  CKTOTAL --  CKMB --  CKMBINDEX --  TROPONINI <0.30    IMAGING STUDIES Dg Chest 2 View  01/23/2012  *RADIOLOGY REPORT*  Clinical Data: Fever, weakness.  CHEST - 2 VIEW  Comparison: 07/08/2011  Findings: Right greater than left lung base opacity.  Background interstitial coarsening and hyperinflation.  Heart size upper normal to mildly enlarged.  Mediastinal contours otherwise within normal range.  No pneumothorax.  Trace right fluid not excluded. No acute osseous finding.  IMPRESSION: Right greater than left lung base consolidation, may represent pneumonia.   Original Report Authenticated By: Carlos Levering, M.D.    Ct Chest Wo Contrast  01/23/2012  *RADIOLOGY REPORT*  Clinical Data: Progressively worsening shortness of breath initially starting several days ago.  Productive cough.  Subjective fever and chills.  CT CHEST WITHOUT CONTRAST  Technique:  Multidetector CT imaging of the chest was performed following the standard protocol without IV contrast.  Comparison: Chest radiograph, 01/23/2012  Findings: There is bronchial wall thickening with evidence of mucus plugging in the lower lobes.  There is consolidation in both posterior lower lobes, greater on the right.  A minimal amount of right pleural fluid is present.   Mucous/secretions mostly opacifies the right lower lobe bronchus.  There are milder degrees of bronchial wall thickening extending into the upper lobes and right middle lobe.  The lungs also show areas of peripheral reticular opacity most suggestive of interstitial scarring.  Mild pleural parenchymal scarring is noted at the apices.  No pulmonary masses or nodules.  The heart is normal in size.  No mediastinal or hilar masses or adenopathy noted.  Limited evaluation of the upper abdomen is unremarkable.  The bony thorax is unremarkable.  IMPRESSION: Bronchial wall thickening with significant bronchial mucus/secretions mostly in the lower lobes associated with areas of dependent lung consolidation.  The findings are consistent with both bronchitis and  areas of lobar pneumonia.  No other areas of bronchial wall thickening may also be due to acute bronchitis, but can be chronic.  There is mild chronic interstitial thickening.  Minimal right pleural effusion.   Original Report Authenticated By: Lajean Manes, M.D.     DISCHARGE EXAMINATION: Filed Vitals:   01/25/12 2149 01/26/12 0258 01/26/12 0541 01/26/12 1147  BP:   144/82   Pulse:   61   Temp:   98.4 F (36.9 C)   TempSrc:   Oral   Resp:   16   Height:      Weight:      SpO2: 92% 95% 91% 92%   General appearance: alert, cooperative, appears stated age and no distress Resp: Improved air entry bilaterally. Few crackles but mostly clear. Cardio: regular rate and rhythm, S1, S2 normal, no murmur, click, rub or gallop GI: soft, non-tender; bowel sounds normal; no masses,  no organomegaly Neurologic: Alert. No focal deficits  DISPOSITION: Home with Son  Discharge Orders    Future Orders Please Complete By Expires   Diet - low sodium heart healthy      Increase activity slowly      Discharge instructions      Comments:   Be sure to follow up with your PCP as soon as possible. Seek attention if the cough returns or if you develop fever, chills,  shortness of breath.     Current Discharge Medication List    START taking these medications   Details  docusate sodium 100 MG CAPS Take 100 mg by mouth 2 (two) times daily. Qty: 30 capsule, Refills: 0    levofloxacin (LEVAQUIN) 500 MG tablet Take 1 tablet (500 mg total) by mouth daily. For 7 days Qty: 7 tablet, Refills: 0      CONTINUE these medications which have CHANGED   Details  albuterol (PROVENTIL HFA;VENTOLIN HFA) 108 (90 BASE) MCG/ACT inhaler Inhale 2 puffs into the lungs every 6 (six) hours. Scheduled per nursing home mar.For wheezing Qty: 1 Inhaler, Refills: 1    Calcium Carbonate-Vitamin D (CALCIUM 600+D) 600-400 MG-UNIT per tablet Take 1 tablet by mouth 2 (two) times daily. Qty: 60 tablet, Refills: 1    !! divalproex (DEPAKOTE) 250 MG DR tablet Take 1 tablet (250 mg total) by mouth every morning. Qty: 30 tablet, Refills: 1    !! divalproex (DEPAKOTE) 500 MG DR tablet Take 1 tablet (500 mg total) by mouth at bedtime. Qty: 30 tablet, Refills: 0    fluticasone (FLONASE) 50 MCG/ACT nasal spray Place 2 sprays into the nose daily. scheduled Qty: 16 g, Refills: 0    Fluticasone-Salmeterol (ADVAIR) 250-50 MCG/DOSE AEPB Inhale 1 puff into the lungs every 12 (twelve) hours. scheduled Qty: 60 each, Refills: 0    loratadine (CLARITIN) 10 MG tablet Take 1 tablet (10 mg total) by mouth daily. scheduled Qty: 30 tablet, Refills: 0    montelukast (SINGULAIR) 10 MG tablet Take 1 tablet (10 mg total) by mouth daily. Qty: 30 tablet, Refills: 0    omeprazole (PRILOSEC) 20 MG capsule Take 1 capsule (20 mg total) by mouth daily. Qty: 30 capsule, Refills: 0    risperiDONE (RISPERDAL M-TABS) 2 MG disintegrating tablet Take 1 tablet (2 mg total) by mouth 2 (two) times daily. Qty: 60 tablet, Refills: 0    tolterodine (DETROL LA) 4 MG 24 hr capsule Take 1 capsule (4 mg total) by mouth at bedtime. Qty: 30 capsule, Refills: 0     !! - Potential duplicate medications  found. Please  discuss with provider.    STOP taking these medications     loperamide (IMODIUM) 2 MG capsule      oxymetazoline (AFRIN) 0.05 % nasal spray        Follow-up Information    Follow up with [place], MD. Schedule an appointment as soon as possible for a visit in 1 week.   Contact information:   [place], [place] [address] [phone]          TOTAL DISCHARGE TIME: 32 mins  [facility] Pager [phone]  01/26/2012, 12:27 PM

## 2012-01-26 NOTE — Progress Notes (Signed)
Patient comfortably resting, will continue to monitor

## 2012-01-26 NOTE — Evaluation (Signed)
Occupational Therapy Evaluation Patient Details Name: Miranda Ballard MRN: [id number] DOB: [birth year] Today's Date: 01/26/2012 Time: 9532-0233 OT Time Calculation (min): 28 min  OT Assessment / Plan / Recommendation Clinical Impression  Pt is a 71 yo schizophrenic female who presents with HCAP. Pt's family wants to bring her home at d/c but feel pt would beneifit from st-snf stay. Skilled OT indicated to address the below mentioned deficits in prep for safe d/c to next venue.    OT Assessment  Patient needs continued OT Services    Follow Up Recommendations  SNF;Home health OT;Supervision/Assistance - 24 hour (If family refuses snf than HHOT w 24/7 A.)    Barriers to Discharge   [place] family present, unable to reach by phone.  Equipment Recommendations  3 in 1 bedside comode    Recommendations for Other Services    Frequency  Min 2X/week    Precautions / Restrictions Precautions Precautions: Fall Restrictions Weight Bearing Restrictions: No   Pertinent Vitals/Pain Pt denied pain    ADL  Grooming: Minimal assistance;Wash/dry hands Where Assessed - Grooming: Supported standing Upper Body Bathing: Minimal assistance Where Assessed - Upper Body Bathing: Unsupported sitting Lower Body Bathing: Moderate assistance Where Assessed - Lower Body Bathing: Supported sit to stand Upper Body Dressing: Minimal assistance Where Assessed - Upper Body Dressing: Unsupported sitting Lower Body Dressing: Moderate assistance Where Assessed - Lower Body Dressing: Supported sit to Lobbyist: Minimal assistance Armed forces technical officer Method: Sit to Loss adjuster, chartered: Comfort height toilet Toileting - Clothing Manipulation and Hygiene: Minimal assistance Where Assessed - Best boy and Hygiene: Sit to stand from 3-in-1 or toilet Equipment Used: Rolling walker Transfers/Ambulation Related to ADLs: Pt ambulated in the hallway with short, shuffling steps.  Ambulated the 1st 150 feet with RW then the last 50 without RW. Pt very unsteady. ADL Comments: Pt demo's a LUE tremor, was able to don/doff socks.    OT Diagnosis: Generalized weakness  OT Problem List: Decreased strength;Decreased safety awareness;Decreased activity tolerance;Decreased cognition;Impaired balance (sitting and/or standing);Decreased knowledge of use of DME or AE OT Treatment Interventions: Self-care/ADL training;Therapeutic activities;DME and/or AE instruction;Patient/family education;Balance training   OT Goals Acute Rehab OT Goals OT Goal Formulation: Patient unable to participate in goal setting Time For Goal Achievement: 02/09/12 Potential to Achieve Goals: Good ADL Goals Pt Will Perform Grooming: with supervision;Standing at sink ADL Goal: Grooming - Progress: Goal set today Pt Will Transfer to Toilet: with supervision;Ambulation;with DME ADL Goal: Toilet Transfer - Progress: Goal set today Pt Will Perform Toileting - Clothing Manipulation: with supervision;Sitting on 3-in-1 or toilet;Standing ADL Goal: Toileting - Clothing Manipulation - Progress: Goal set today Pt Will Perform Toileting - Hygiene: with supervision;Sit to stand from 3-in-1/toilet ADL Goal: Toileting - Hygiene - Progress: Goal set today Additional ADL Goal #1: Pt will complete all aspects of bathing and dressing with supervision ADL Goal: Additional Goal #1 - Progress: Goal set today  Visit Information  Last OT Received On: 01/26/12 Assistance Needed: +1 PT/OT Co-Evaluation/Treatment: Yes    Subjective Data  Subjective: Pt's speech garbled, difficult to understand. Patient Stated Goal: Pt did not state.   Prior Functioning     Home Living Lives With: Other (Comment) [facility] Available Help at Discharge: Family Type of Home: House Home Access: Stairs to enter Additional Comments: Unable to obtain info, pt poor historian, unable to provide. Prior Function Comments: Unable to  obtain info, pt poor historian, unable to provide.  Communication Communication: Expressive difficulties  Vision/Perception     Cognition  Overall Cognitive Status: No family/caregiver present to determine baseline cognitive functioning Area of Impairment: Attention;Following commands;Memory;Safety/judgement;Awareness of errors;Awareness of deficits;Problem solving Arousal/Alertness: Awake/alert Orientation Level: Disoriented to;Time;Situation Behavior During Session: [facility] for tasks performed Current Attention Level: Focused Memory: Decreased recall of precautions Following Commands: Follows one step commands inconsistently Safety/Judgement: Decreased safety judgement for tasks assessed;Decreased awareness of safety precautions Awareness of Errors: Assistance required to identify errors made;Assistance required to correct errors made Cognition - Other Comments: No family to determine pts cognitive baseline.     Extremity/Trunk Assessment Right Upper Extremity Assessment RUE ROM/Strength/Tone: Deficits RUE ROM/Strength/Tone Deficits: generalized weakness per functional observation. Left Upper Extremity Assessment LUE ROM/Strength/Tone: Deficits LUE ROM/Strength/Tone Deficits: generalized weakness per functional observation. Right Lower Extremity Assessment RLE ROM/Strength/Tone: Deficits RLE ROM/Strength/Tone Deficits: Pt with generalized weakness, grossly 3/5 per functional assessment.  Left Lower Extremity Assessment LLE ROM/Strength/Tone: Deficits LLE ROM/Strength/Tone Deficits: Pt with generalized weakness, grossly 3/5 per functional assessment.  Trunk Assessment Trunk Assessment: Kyphotic     Mobility Bed Mobility Bed Mobility: Supine to Sit Supine to Sit: 4: Min assist;HOB elevated;With rails Details for Bed Mobility Assistance: cues for hand placement and safety. Transfers Transfers: Sit to Stand;Stand to Sit Sit to Stand: 4: Min assist;3: Mod assist;With  upper extremity assist;From toilet;From bed Stand to Sit: 4: Min assist;3: Mod assist;With upper extremity assist;To chair/3-in-1;To toilet Details for Transfer Assistance: Assist needed to rise, stabilize and control descent. Max cues for hand placement and safety.     Shoulder Instructions     Exercise     Balance Dynamic Sitting Balance Dynamic Sitting - Balance Support: No upper extremity supported;Feet supported Dynamic Sitting - Level of Assistance: 4: Min assist;5: Stand by assistance (Pt initially demo's a posterior lean when donning socks.)   End of Session OT - End of Session Equipment Utilized During Treatment: Gait belt Activity Tolerance: Patient tolerated treatment well Patient left: in chair;with call bell/phone within reach  [place] A OTR/L [phone] 01/26/2012, 2:47 PM

## 2012-01-26 NOTE — Progress Notes (Signed)
Per Charlena Cross at [facility], facility will need Pt's FL2, even though she will not be returning to their facility.  CSW to complete FL2 and fax it to [facility].  No further CSW needs identified.  Bernita Raisin, [place] [facility] [phone]

## 2012-01-26 NOTE — Evaluation (Signed)
Physical Therapy Evaluation Patient Details Name: Miranda Ballard MRN: [id number] DOB: October 31, 1940 Today's Date: 01/26/2012 Time: 1410-1430 PT Time Calculation (min): 20 min  PT Assessment / Plan / Recommendation Clinical Impression  Pt is a 71 yo schizophrenic female who presents with HCAP.  Noted that pts family wants to bring her home, however feel that pt will benefit from SNF for increased safety.  Unsure of how much assist pts family able to provide.  Pt will benefit from skilled PT in acute venue to address deficits.      PT Assessment  Patient needs continued PT services    Follow Up Recommendations  SNF;Supervision/Assistance - 24 hour    Does the patient have the potential to tolerate intense rehabilitation      Barriers to Discharge   unsure of caregiver support.     Equipment Recommendations  Rolling walker with 5" wheels    Recommendations for Other Services     Frequency Min 3X/week    Precautions / Restrictions Precautions Precautions: Fall Restrictions Weight Bearing Restrictions: No   Pertinent Vitals/Pain No pain mentioned.       Mobility  Bed Mobility Bed Mobility: Supine to Sit Supine to Sit: 4: Min assist Sit to Supine: 4: Min assist Details for Bed Mobility Assistance: Assist for trunk with cues for hand placement and safety.  Several cues to keep pt attended to task.  Transfers Transfers: Sit to Stand;Stand to Sit Sit to Stand: 4: Min assist;From chair/3-in-1;From toilet Stand to Sit: 4: Min assist;To bed;To toilet Details for Transfer Assistance: Assist to rise, stabalize and ensure controlled descent with max cues for hand placement, safety and keeping RW with her until all the way at seated surface.  Ambulation/Gait Ambulation/Gait Assistance: 4: Min assist;3: Mod assist Ambulation Distance (Feet): 150 Feet Assistive device: Rolling walker;None Ambulation/Gait Assistance Details: Initially used RW when ambulating with pt and noted severe left  veering.  Pt would run into wall and require assist to correct.  Max cues for sequencing/technique and maintaining straight direction.  Transitioned to more mod assist with no AD and noted pt lean/veers to left.  Pt unsteady with both and unable to properly use RW, however feel she is more stable with RW than without.  Gait Pattern: Step-through pattern;Decreased stride length Gait velocity: decreased Stairs: No Wheelchair Mobility Wheelchair Mobility: No    Shoulder Instructions     Exercises     PT Diagnosis: Difficulty walking;Generalized weakness  PT Problem List: Decreased strength;Decreased activity tolerance;Decreased balance;Decreased mobility;Decreased knowledge of use of DME;Decreased cognition;Decreased safety awareness;Decreased knowledge of precautions PT Treatment Interventions: DME instruction;Gait training;Stair training;Functional mobility training;Therapeutic activities;Therapeutic exercise;Balance training;Patient/family education   PT Goals Acute Rehab PT Goals PT Goal Formulation: With patient Time For Goal Achievement: 02/09/12 Potential to Achieve Goals: Good Pt will go Supine/Side to Sit: with supervision PT Goal: Supine/Side to Sit - Progress: Goal set today Pt will go Sit to Supine/Side: with supervision PT Goal: Sit to Supine/Side - Progress: Goal set today Pt will go Sit to Stand: with supervision PT Goal: Sit to Stand - Progress: Goal set today Pt will go Stand to Sit: with supervision PT Goal: Stand to Sit - Progress: Goal set today Pt will Ambulate: 51 - 150 feet;with supervision;with least restrictive assistive device PT Goal: Ambulate - Progress: Goal set today  Visit Information  Last PT Received On: 01/26/12 Assistance Needed: +1 PT/OT Co-Evaluation/Treatment: Yes    Subjective Data  Subjective: I haven't had a bowel movement yet.  Prior Functioning  Home Living Lives With: Other (Comment) [facility] Available Help at Discharge:  Family Type of Home: House Home Access: Stairs to enter Additional Comments: Unable to obtain info, pt poor historian, unable to provide. Prior Function Comments: Unable to obtain info, pt poor historian, unable to provide.  Communication Communication: Expressive difficulties    Cognition  Overall Cognitive Status: No family/caregiver present to determine baseline cognitive functioning Area of Impairment: Attention;Following commands;Memory;Safety/judgement;Awareness of errors;Awareness of deficits;Problem solving Arousal/Alertness: Awake/alert Orientation Level: Disoriented to;Time;Situation Behavior During Session: [facility] for tasks performed Current Attention Level: Focused Memory: Decreased recall of precautions Following Commands: Follows one step commands inconsistently Safety/Judgement: Decreased safety judgement for tasks assessed;Decreased awareness of safety precautions Awareness of Errors: Assistance required to identify errors made;Assistance required to correct errors made Cognition - Other Comments: No family to determine pts cognitive baseline.     Extremity/Trunk Assessment Right Upper Extremity Assessment RUE ROM/Strength/Tone: Deficits RUE ROM/Strength/Tone Deficits: generalized weakness per functional observation. Left Upper Extremity Assessment LUE ROM/Strength/Tone: Deficits LUE ROM/Strength/Tone Deficits: generalized weakness per functional observation. Right Lower Extremity Assessment RLE ROM/Strength/Tone: Deficits RLE ROM/Strength/Tone Deficits: Pt with generalized weakness, grossly 3/5 per functional assessment.  Left Lower Extremity Assessment LLE ROM/Strength/Tone: Deficits LLE ROM/Strength/Tone Deficits: Pt with generalized weakness, grossly 3/5 per functional assessment.  Trunk Assessment Trunk Assessment: Kyphotic   Balance Dynamic Sitting Balance Dynamic Sitting - Balance Support: No upper extremity supported;Feet supported Dynamic Sitting - Level of  Assistance: 4: Min assist;5: Stand by assistance (Pt initially demo's a posterior lean when donning socks.)  End of Session PT - End of Session Equipment Utilized During Treatment: Gait belt Activity Tolerance: Patient tolerated treatment well Patient left: in bed;with call bell/phone within reach;with bed alarm set Nurse Communication: Mobility status  GP     Page, Betha Loa 01/26/2012, 3:14 PM

## 2012-01-28 NOTE — ED Provider Notes (Signed)
Medical screening examination/treatment/procedure(s) were performed by non-physician practitioner and as supervising physician I was immediately available for consultation/collaboration.   Hoy Morn, MD 01/28/12 [phone]

## 2012-01-29 LAB — CULTURE, BLOOD (ROUTINE X 2)
Culture: NO GROWTH
Culture: NO GROWTH

## 2012-03-19 ENCOUNTER — Other Ambulatory Visit: Payer: Self-pay

## 2012-09-07 ENCOUNTER — Other Ambulatory Visit: Payer: Self-pay

## 2012-12-08 ENCOUNTER — Other Ambulatory Visit: Payer: Self-pay

## 2013-02-15 ENCOUNTER — Other Ambulatory Visit: Payer: Self-pay

## 2013-02-15 DIAGNOSIS — Z1231 Encounter for screening mammogram for malignant neoplasm of breast: Secondary | ICD-10-CM

## 2013-03-14 ENCOUNTER — Ambulatory Visit: Payer: Medicare Other

## 2013-07-14 DIAGNOSIS — I2723 Pulmonary hypertension due to lung diseases and hypoxia: Secondary | ICD-10-CM

## 2013-07-14 HISTORY — DX: Pulmonary hypertension due to lung diseases and hypoxia: I27.23

## 2013-08-07 ENCOUNTER — Ambulatory Visit: Payer: Medicare Other | Admitting: Cardiology

## 2013-08-09 ENCOUNTER — Ambulatory Visit (INDEPENDENT_AMBULATORY_CARE_PROVIDER_SITE_OTHER): Payer: Medicare Other | Admitting: Cardiology

## 2013-08-09 ENCOUNTER — Encounter: Payer: Self-pay | Admitting: Cardiology

## 2013-08-09 VITALS — BP 162/90 | HR 50 | Ht 66.0 in | Wt 105.0 lb

## 2013-08-09 DIAGNOSIS — F209 Schizophrenia, unspecified: Secondary | ICD-10-CM

## 2013-08-09 DIAGNOSIS — I10 Essential (primary) hypertension: Secondary | ICD-10-CM | POA: Insufficient documentation

## 2013-08-09 DIAGNOSIS — I272 Pulmonary hypertension, unspecified: Secondary | ICD-10-CM | POA: Insufficient documentation

## 2013-08-09 DIAGNOSIS — I2789 Other specified pulmonary heart diseases: Secondary | ICD-10-CM

## 2013-08-09 DIAGNOSIS — R001 Bradycardia, unspecified: Secondary | ICD-10-CM

## 2013-08-09 DIAGNOSIS — I498 Other specified cardiac arrhythmias: Secondary | ICD-10-CM

## 2013-08-09 NOTE — Assessment & Plan Note (Signed)
Her RV pressure is 60 mmHG, she has no SOB.  I have increased her Lisinopril and will have her follow up with Dr. Ellyn Hack (first MD with an open appt- she is new) for review of echo- I did not have on visit.
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
## 2013-08-09 NOTE — Assessment & Plan Note (Signed)
Currently stable. Controlled on current meds.

## 2013-08-09 NOTE — Assessment & Plan Note (Addendum)
I am not sure if this is chronic bradycardia or related to Navane. I have sent a letter with her son to Dr. Lavina Hamman her psychiatrist to see if this med could be adjusted.  Follow up soon to follow HR, not sure she could tolerate an event monitor or a holter.

## 2013-08-09 NOTE — Assessment & Plan Note (Signed)
Poorly controlled, will increase Lisinopril to 10 mg daily.

## 2013-08-09 NOTE — Patient Instructions (Signed)
1. Your physician recommends that you schedule a follow-up appointment first available and earliest appointment with either our office or the [address] office  2. Increase your lisinopril to 10 mg Daily

## 2013-08-14 ENCOUNTER — Encounter: Payer: Self-pay | Admitting: Cardiology

## 2013-08-14 ENCOUNTER — Ambulatory Visit (INDEPENDENT_AMBULATORY_CARE_PROVIDER_SITE_OTHER): Payer: Medicare Other | Admitting: Cardiology

## 2013-08-14 VITALS — BP 138/82 | HR 54 | Ht 66.0 in | Wt 98.7 lb

## 2013-08-14 DIAGNOSIS — F209 Schizophrenia, unspecified: Secondary | ICD-10-CM

## 2013-08-14 DIAGNOSIS — I498 Other specified cardiac arrhythmias: Secondary | ICD-10-CM

## 2013-08-14 DIAGNOSIS — I272 Pulmonary hypertension, unspecified: Secondary | ICD-10-CM

## 2013-08-14 DIAGNOSIS — I2789 Other specified pulmonary heart diseases: Secondary | ICD-10-CM

## 2013-08-14 DIAGNOSIS — R001 Bradycardia, unspecified: Secondary | ICD-10-CM

## 2013-08-14 DIAGNOSIS — I10 Essential (primary) hypertension: Secondary | ICD-10-CM

## 2013-08-14 NOTE — Patient Instructions (Signed)
SCHEDULE IN AUG 2015---Your physician has requested that you have an echocardiogram. Echocardiography is a painless test that uses sound waves to create images of your heart. It provides your doctor with information about the size and shape of your heart and how well your heart's chambers and valves are working. This procedure takes approximately one hour. There are no restrictions for this procedure.    CONTINUE WITH CURRENT MEDICATION   Your physician wants you to follow-up in 6 MONTH DR HARDING  30 MIN APPT.  You will receive a reminder letter in the mail two months in advance. If you don't receive a letter, please call our office to schedule the follow-up appointment.

## 2013-08-16 ENCOUNTER — Encounter: Payer: Self-pay | Admitting: Cardiology

## 2013-08-16 NOTE — Assessment & Plan Note (Signed)
I don't think she actually got the increased dose of lisinopril ordered. We will go ahead redo the prescription for 10 mg. Her blood pressure still a bit high today. At the completion of her blood pressures around 938 systolic we may better help her potential diastolic dysfunction and elevated pulmonary pressures.

## 2013-08-16 NOTE — Assessment & Plan Note (Addendum)
Elevated PA pressures on echocardiogram in the absence of symptoms make her very interesting dilemma. Classically he 1 would perform right heart catheterization to confirm or deny the presence of elevated pressures, however that would not be a great option for this patient would not tolerate invasive procedure. Especially with her not being symptomatic, my inclination would be to simply continue to treat her systolic hypertension with the hopes that this would reduce left-sided filling pressures and improve pulmonary pressures. She does not have any signs of volume overload which would warrant the use of diuretics.  Continue lisinopril at current dose. May titrate in the future to provide additional afterload reduction. We'll probably reassess her pulmonary pressures by echocardiogram prior to 6 month follow

## 2013-08-16 NOTE — Assessment & Plan Note (Signed)
Not overly concerning heart rate of 54 beats per minute. Certainly would not use an AV nodal agent if calcium channel treatment is warranted for pulmonary hypertension. As far as I can tell she is not having symptoms of dizziness or lightheadedness from bradycardia.
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
## 2013-08-16 NOTE — Assessment & Plan Note (Signed)
She doesn't seem to be having breakthrough this psychosis, however she is a very poor historian and not trauma to his related to her baseline functional status versus schizophrenia related.  She most certainly would not tolerate invasive procedures, nor would he want to continue with aggressive medical management of something that is not symptomatic. I would not put her on a diuretic with her being on antipsychotic medication for fear of electrolyte abnormalities exacerbating potential arrhythmias.

## 2013-08-24 NOTE — Addendum Note (Signed)
Addended by: Vennie Homans on: 08/24/2013 04:31 PM   Modules accepted: Orders

## 2013-09-19 ENCOUNTER — Ambulatory Visit (HOSPITAL_COMMUNITY)
Admission: RE | Admit: 2013-09-19 | Discharge: 2013-09-19 | Disposition: A | Discharge status: Home / Self Care | Payer: Medicare Other | Source: Ambulatory Visit | Attending: Cardiology | Admitting: Cardiology

## 2013-09-19 DIAGNOSIS — I272 Pulmonary hypertension, unspecified: Secondary | ICD-10-CM

## 2014-04-11 ENCOUNTER — Telehealth (HOSPITAL_COMMUNITY): Payer: Self-pay | Admitting: *Deleted

## 2014-04-12 ENCOUNTER — Emergency Department (HOSPITAL_COMMUNITY)
Admission: EM | Admit: 2014-04-12 | Discharge: 2014-04-12 | Disposition: A | Discharge status: Home / Self Care | Payer: Medicare Other | Attending: Emergency Medicine | Admitting: Emergency Medicine

## 2014-04-12 ENCOUNTER — Emergency Department (HOSPITAL_COMMUNITY): Payer: Medicare Other

## 2014-04-12 ENCOUNTER — Encounter (HOSPITAL_COMMUNITY): Payer: Self-pay | Admitting: *Deleted

## 2014-04-12 DIAGNOSIS — Z7951 Long term (current) use of inhaled steroids: Secondary | ICD-10-CM | POA: Diagnosis not present

## 2014-04-12 DIAGNOSIS — Z720 Tobacco use: Secondary | ICD-10-CM | POA: Diagnosis not present

## 2014-04-12 DIAGNOSIS — Z862 Personal history of diseases of the blood and blood-forming organs and certain disorders involving the immune mechanism: Secondary | ICD-10-CM | POA: Insufficient documentation

## 2014-04-12 DIAGNOSIS — J449 Chronic obstructive pulmonary disease, unspecified: Secondary | ICD-10-CM | POA: Diagnosis not present

## 2014-04-12 DIAGNOSIS — I10 Essential (primary) hypertension: Secondary | ICD-10-CM | POA: Insufficient documentation

## 2014-04-12 DIAGNOSIS — F209 Schizophrenia, unspecified: Secondary | ICD-10-CM | POA: Insufficient documentation

## 2014-04-12 DIAGNOSIS — Z79899 Other long term (current) drug therapy: Secondary | ICD-10-CM | POA: Diagnosis not present

## 2014-04-12 DIAGNOSIS — I509 Heart failure, unspecified: Secondary | ICD-10-CM | POA: Diagnosis not present

## 2014-04-12 DIAGNOSIS — Z8669 Personal history of other diseases of the nervous system and sense organs: Secondary | ICD-10-CM | POA: Diagnosis not present

## 2014-04-12 DIAGNOSIS — K219 Gastro-esophageal reflux disease without esophagitis: Secondary | ICD-10-CM | POA: Diagnosis not present

## 2014-04-12 DIAGNOSIS — F329 Major depressive disorder, single episode, unspecified: Secondary | ICD-10-CM | POA: Diagnosis not present

## 2014-04-12 DIAGNOSIS — M1990 Unspecified osteoarthritis, unspecified site: Secondary | ICD-10-CM | POA: Diagnosis not present

## 2014-04-12 DIAGNOSIS — R05 Cough: Secondary | ICD-10-CM | POA: Insufficient documentation

## 2014-04-12 DIAGNOSIS — R059 Cough, unspecified: Secondary | ICD-10-CM

## 2014-04-12 DIAGNOSIS — Z008 Encounter for other general examination: Secondary | ICD-10-CM | POA: Diagnosis present

## 2014-04-12 HISTORY — DX: Depression, unspecified: F32.A

## 2014-04-12 HISTORY — DX: Thrombocytopenia, unspecified: D69.6

## 2014-04-12 HISTORY — DX: Heart failure, unspecified: I50.9

## 2014-04-12 HISTORY — DX: Major depressive disorder, single episode, unspecified: F32.9

## 2014-04-12 HISTORY — DX: Dysphasia: R47.02

## 2014-04-12 HISTORY — DX: Parkinson's disease without dyskinesia, without mention of fluctuations: G20.A1

## 2014-04-12 HISTORY — DX: Parkinson's disease: G20

## 2014-04-12 HISTORY — DX: Unspecified urinary incontinence: R32

## 2014-04-12 LAB — URINALYSIS, ROUTINE W REFLEX MICROSCOPIC
Bilirubin Urine: NEGATIVE
Glucose, UA: NEGATIVE mg/dL
HGB URINE DIPSTICK: NEGATIVE
KETONES UR: NEGATIVE mg/dL
Leukocytes, UA: NEGATIVE
NITRITE: NEGATIVE
PROTEIN: NEGATIVE mg/dL
Specific Gravity, Urine: 1.014 (ref 1.005–1.030)
Urobilinogen, UA: 0.2 mg/dL (ref 0.0–1.0)
pH: 5.5 (ref 5.0–8.0)

## 2014-04-12 LAB — COMPREHENSIVE METABOLIC PANEL
ALK PHOS: 80 U/L (ref 39–117)
ALT: 19 U/L (ref 0–35)
AST: 24 U/L (ref 0–37)
Albumin: 3.3 g/dL — ABNORMAL LOW (ref 3.5–5.2)
Anion gap: 6 (ref 5–15)
BUN: 13 mg/dL (ref 6–23)
CHLORIDE: 105 mmol/L (ref 96–112)
CO2: 29 mmol/L (ref 19–32)
Calcium: 9 mg/dL (ref 8.4–10.5)
Creatinine, Ser: 1.04 mg/dL (ref 0.50–1.10)
GFR, EST AFRICAN AMERICAN: 60 mL/min — AB (ref >90)
GFR, EST NON AFRICAN AMERICAN: 52 mL/min — AB (ref >90)
GLUCOSE: 95 mg/dL (ref 70–99)
POTASSIUM: 4.4 mmol/L (ref 3.5–5.1)
Sodium: 140 mmol/L (ref 135–145)
TOTAL PROTEIN: 7.4 g/dL (ref 6.0–8.3)
Total Bilirubin: 0.6 mg/dL (ref 0.3–1.2)

## 2014-04-12 LAB — CBC
HCT: 36 % (ref 36.0–46.0)
Hemoglobin: 11.9 g/dL — ABNORMAL LOW (ref 12.0–15.0)
MCH: 31.2 pg (ref 26.0–34.0)
MCHC: 33.1 g/dL (ref 30.0–36.0)
MCV: 94.5 fL (ref 78.0–100.0)
Platelets: 235 10*3/uL (ref 150–400)
RBC: 3.81 MIL/uL — AB (ref 3.87–5.11)
RDW: 13.5 % (ref 11.5–15.5)
WBC: 5.4 10*3/uL (ref 4.0–10.5)

## 2014-04-12 LAB — RAPID URINE DRUG SCREEN, HOSP PERFORMED
Amphetamines: NOT DETECTED
Barbiturates: NOT DETECTED
Benzodiazepines: NOT DETECTED
Cocaine: NOT DETECTED
OPIATES: NOT DETECTED
Tetrahydrocannabinol: NOT DETECTED

## 2014-04-12 LAB — ETHANOL: Alcohol, Ethyl (B): <5 mg/dL (ref 0–9)

## 2014-04-12 LAB — ACETAMINOPHEN LEVEL: Acetaminophen (Tylenol), Serum: <10 ug/mL — ABNORMAL LOW (ref 10–30)

## 2014-04-12 LAB — SALICYLATE LEVEL

## 2014-04-12 NOTE — ED Notes (Signed)
Pt placed back on 5 lead BP cuff and pulse ox
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
## 2014-04-12 NOTE — ED Provider Notes (Addendum)
CSN: [id number]     Arrival date & time 04/12/14  1634 History   First MD Initiated Contact with Patient 04/12/14 2028     Chief Complaint  Patient presents with  . Medical Clearance      HPI  Patient presents for evaluation accompanied by her son. She is at an assisted-living facility. He carries a diagnosis of schizophrenia. Son states that in September started having episodes of becoming agitated. He states that they started giving her "an antipsychotic". She has a lifetime history of schizophrenia.  He states her B behavior became different over the last several weeks. He states that she has been formally dismissed from the nursing home. He states that they've given them 30 days to find a new facility for her. He presents her tonight stating that her behavior became more acutely worse over the last several days. In review of the notes from the residence facility she is complaining that she is pregnant and that she delivered a baby in the toilet this was on March 5. On the eighth in complaining that she was pregnant in her abdomen was hurting. On the H complained that she was having a baby but not Taking it away. States today that the patient had to be redirected restrained because of being combative and swinging and becoming aggressive physically towards staff and hitting herself.  Patient and son are unaware of any physical complaints of pain other than her complaint of intermittent abdominal pain. No complaint of fevers chills dysuria or other symptoms.  Past Medical History  Diagnosis Date  . Hypertension   . COPD (chronic obstructive pulmonary disease)   . Arthritis   . Schizophrenia   . Osteoporosis   . GERD (gastroesophageal reflux disease)   . Peripheral vascular disease   . Glaucoma   . 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 EF 55% with normal wall motion. Mild-moderate MR; moderate TR, RV SV greater than 60 mmHg. (Echo from  December 2013 revealed PA pressures of roughly 30-40 mmHg. The consideration was probable diastolic dysfunction related elevated pulmonary pressures.)  . Incontinence   . Depression   . Parkinson's disease   . CHF (congestive heart failure)   . Thrombocytopenia   . Dysphasia    History reviewed. No pertinent past surgical history. Family History  Problem Relation Age of Onset  . Cancer Mother   . Cancer Father   . Cancer Sister    History  Substance Use Topics  . Smoking status: Current Every Day Smoker -- 0.50 packs/day    Types: Cigarettes  . Smokeless tobacco: Not on file  . Alcohol Use: No   OB History    No data available     Review of Systems  Unable to perform ROS: Other      Allergies  Review of patient's allergies indicates no known allergies.  Home Medications   Prior to Admission medications   Medication Sig Start Date End Date Taking? Authorizing Provider  acetaminophen (TYLENOL) 325 MG tablet Take 325 mg by mouth every 6 (six) hours as needed.    Historical Provider, MD  albuterol (PROVENTIL HFA;VENTOLIN HFA) 108 (90 BASE) MCG/ACT inhaler Inhale 2 puffs into the lungs every 6 (six) hours. Scheduled per nursing home mar.For wheezing 01/26/12   Bonnielee Haff, MD  Calcium Carb-Cholecalciferol (OYSTERCAL-D) 500-400 MG-UNIT TABS Take 1 tablet by mouth daily.    Historical Provider, MD  Calcium Carbonate-Vitamin D (CALCIUM 600+D) 600-400  MG-UNIT per tablet Take 1 tablet by mouth 2 (two) times daily. 01/26/12   Bonnielee Haff, MD  dimenhyDRINATE (DRAMAMINE) 50 MG tablet Take 50 mg by mouth every 8 (eight) hours as needed.    Historical Provider, MD  docusate sodium 100 MG CAPS Take 100 mg by mouth 2 (two) times daily. 01/26/12   Bonnielee Haff, MD  ferrous sulfate 325 (65 FE) MG tablet Take 325 mg by mouth 2 (two) times daily with a meal.    Historical Provider, MD  fluticasone (FLONASE) 50 MCG/ACT nasal spray Place 2 sprays into the nose daily. scheduled 01/26/12    Bonnielee Haff, MD  fluticasone (VERAMYST) 27.5 MCG/SPRAY nasal spray Place 2 sprays into the nose daily.    Historical Provider, MD  Fluticasone-Salmeterol (ADVAIR) 250-50 MCG/DOSE AEPB Inhale 1 puff into the lungs every 12 (twelve) hours. scheduled 01/26/12   Bonnielee Haff, MD  furosemide (LASIX) 20 MG tablet Take 20 mg by mouth daily.    Historical Provider, MD  haloperidol (HALDOL) 1 MG tablet Take 1 mg by mouth 2 (two) times daily.    Historical Provider, MD  lisinopril (PRINIVIL,ZESTRIL) 5 MG tablet Take 5 mg by mouth daily.    Historical Provider, MD  loratadine (CLARITIN) 10 MG tablet Take 1 tablet (10 mg total) by mouth daily. scheduled 01/26/12   Bonnielee Haff, MD  LORazepam (ATIVAN) 1 MG tablet Take 1 mg by mouth every 8 (eight) hours. Taking one half tablet daily PRN    Historical Provider, MD  meclizine (ANTIVERT) 12.5 MG tablet Take 12.5 mg by mouth 3 (three) times daily as needed for dizziness.    Historical Provider, MD  Melatonin 3 MG CAPS Take by mouth.    Historical Provider, MD  montelukast (SINGULAIR) 10 MG tablet Take 1 tablet (10 mg total) by mouth daily. 01/26/12   Bonnielee Haff, MD  omeprazole (PRILOSEC) 20 MG capsule Take 1 capsule (20 mg total) by mouth daily. 01/26/12   Bonnielee Haff, MD  thiothixene (NAVANE) 5 MG capsule Take 5 mg by mouth 2 (two) times daily.    Historical Provider, MD  tolterodine (DETROL LA) 4 MG 24 hr capsule Take 1 capsule (4 mg total) by mouth at bedtime. 01/26/12   Bonnielee Haff, MD  traZODone (DESYREL) 100 MG tablet Take 100 mg by mouth at bedtime as needed for sleep.    Historical Provider, MD   BP 150/80 mmHg  Pulse 58  Temp(Src) 98.7 F (37.1 C)  Resp 14  SpO2 97% Physical Exam  Constitutional: She is oriented to person, place, and time. She appears well-developed and well-nourished. No distress.  HENT:  Head: Normocephalic.  Eyes: Conjunctivae are normal. Pupils are equal, round, and reactive to light. No scleral icterus.   Neck: Normal range of motion. Neck supple. No thyromegaly present.  Cardiovascular: Normal rate and regular rhythm.  Exam reveals no gallop and no friction rub.   No murmur heard. Pulmonary/Chest: Effort normal and breath sounds normal. No respiratory distress. She has no wheezes. She has no rales.  Abdominal: Soft. Bowel sounds are normal. She exhibits no distension. There is no tenderness. There is no rebound.  Musculoskeletal: Normal range of motion.  Neurological: She is alert and oriented to person, place, and time.  Skin: Skin is warm and dry. No rash noted.  Psychiatric: She has a normal mood and affect. Her behavior is normal.  Calm. Able answer simple questions.    ED Course  Procedures (including critical care time) Labs Review Labs  Reviewed  ACETAMINOPHEN LEVEL - Abnormal; Notable for the following:    Acetaminophen (Tylenol), Serum <10.0 (*)    All other components within normal limits  CBC - Abnormal; Notable for the following:    RBC 3.81 (*)    Hemoglobin 11.9 (*)    All other components within normal limits  COMPREHENSIVE METABOLIC PANEL - Abnormal; Notable for the following:    Albumin 3.3 (*)    GFR calc non Af Amer 52 (*)    GFR calc Af Amer 60 (*)    All other components within normal limits  URINE CULTURE  ETHANOL  SALICYLATE LEVEL  URINE RAPID DRUG SCREEN (HOSP PERFORMED)  URINALYSIS, ROUTINE W REFLEX MICROSCOPIC    Imaging Review Dg Chest 2 View  04/12/2014   CLINICAL DATA:  Increasing violent behavior at assisted living. History of schizophrenia, hypertension, COPD and congestive heart failure.  EXAM: CHEST  2 VIEW  COMPARISON:  CT and radiographs 01/23/2012.  FINDINGS: The heart size and mediastinal contours are stable. There is vascular congestion without overt pulmonary edema. Interval resolution of pleural effusions and bibasilar airspace opacities. Mild biapical scarring appears unchanged. Bilateral cervical ribs noted. No acute osseous findings  evident.  IMPRESSION: No acute cardiopulmonary process.  Mild chronic lung disease.   Electronically Signed   By: Richardean Sale M.D.   On: 04/12/2014 22:03     EKG Interpretation   Date/Time:  Thursday April 12 2014 21:15:25 EST Ventricular Rate:  73 PR Interval:  128 QRS Duration: 76 QT Interval:  448 QTC Calculation: 494 R Axis:   56 Text Interpretation:  Sinus rhythm Ventricular bigeminy Biatrial  enlargement Probable left ventricular hypertrophy Nonspecific T  abnormalities, lateral leads Confirmed by Jeneen Rinks  MD, [place] ([id number]) on  04/12/2014 9:27:30 PM      MDM   Final diagnoses:  Cough  Schizophrenia, unspecified type    No acute abnormality is noted. I had a long discussion with the patient's son, Ollen Gross. I had initially ordered TTS evaluation in hopes of having psychiatry evaluation for medication recommendations. He states that he has an ongoing process and certainly new facility through the social worker at her current care facility. He politely declines any further evaluation. States he simply needed "her to be medically evaluated.    Tanna Furry, MD 04/12/14 2230  Tanna Furry, MD 04/12/14 2231

## 2014-04-12 NOTE — Discharge Instructions (Signed)
Schizophrenia  Miranda Ballard underwent a medical evaluation at [facility] on 3, 10, 2016. No acute medical condition was noted. She is medically clear for evaluation for ongoing inpatient care at appropriate facility.  Schizophrenia is a mental illness. It may cause disturbed or disorganized thinking, speech, or behavior. People with schizophrenia have problems functioning in one or more areas of life: work, school, home, or relationships. People with schizophrenia are at increased risk for suicide, certain chronic physical illnesses, and unhealthy behaviors, such as smoking and drug use. People who have family members with schizophrenia are at higher risk of developing the illness. Schizophrenia affects men and women equally but usually appears at an earlier age (teenage or early adult years) in men.  SYMPTOMS The earliest symptoms are often subtle (prodrome) and may go unnoticed until the illness becomes more severe (first-break psychosis). Symptoms of schizophrenia may be continuous or may come and go in severity. Episodes often are triggered by major life events, such as family stress, college, [employer], marriage, pregnancy or child birth, divorce, or loss of a loved one. People with schizophrenia may see, hear, or feel things that do not exist (hallucinations). They may have false beliefs in spite of obvious proof to the contrary (delusions). Sometimes speech is incoherent or behavior is odd or withdrawn.  DIAGNOSIS Schizophrenia is diagnosed through an assessment by your caregiver. Your caregiver will ask questions about your thoughts, behavior, mood, and ability to function in daily life. Your caregiver may ask questions about your medical history and use of alcohol or drugs, including prescription medication. Your caregiver may also order blood tests and imaging exams. Certain medical conditions and substances can cause symptoms that resemble schizophrenia. Your caregiver may refer  you to a mental health specialist for evaluation. There are three major criterion for a diagnosis of schizophrenia:  Two or more of the following five symptoms are present for a month or longer:  Delusions. Often the delusions are that you are being attacked, harassed, cheated, persecuted or conspired against (persecutory delusions).  Hallucinations.   Disorganized speech that does not make sense to others.  Grossly disorganized (confused or unfocused) behavior or extremely overactive or underactive motor activity (catatonia).  Negative symptoms such as bland or blunted emotions (flat affect), loss of will power (avolition), and withdrawal from social contacts (social isolation).  Level of functioning in one or more major areas of life (work, school, relationships, or self-care) is markedly below the level of functioning before the onset of illness.   There are continuous signs of illness (either mild symptoms or decreased level of functioning) for at least 6 months or longer. TREATMENT  Schizophrenia is a long-term illness. It is best controlled with continuous treatment rather than treatment only when symptoms occur. The following treatments are used to manage schizophrenia:  Medication--Medication is the most effective and important form of treatment for schizophrenia. Antipsychotic medications are usually prescribed to help manage schizophrenia. Other types of medication may be added to relieve any symptoms that may occur despite the use of antipsychotic medications.  Counseling or talk therapy--Individual, group, or family counseling may be helpful in providing education, support, and guidance. Many people with schizophrenia also benefit from social skills and job skills (vocational) training. A combination of medication and counseling is best for managing the disorder over time. A procedure in which electricity is applied to the brain through the scalp (electroconvulsive therapy) may  be used to treat catatonic schizophrenia or schizophrenia in people who cannot take  or do not respond to medication and counseling. Document Released: 01/17/2000 Document Revised: 09/21/2012 Document Reviewed: 04/13/2012 [facility] Patient Information 2015 [place], [place]. This information is not intended to replace advice given to you by your health care provider. Make sure you discuss any questions you have with your health care provider.

## 2014-04-12 NOTE — Progress Notes (Signed)
CSW spoke son Ollen Gross) re: pt's disposition. Per son, pt has until 3/30 to move from [facility] and he is actively pursuing an alternative placement for her at this time.  Son is comfortable with pt returning to the ALF at d/c, although he plans on taking her to his house tonight (if d/c) because of the late hour.  Bobbie at the ALF has been informed and is agreeable to the plan.  ALF given to pt's son, per request.

## 2014-04-14 LAB — URINE CULTURE
Colony Count: NO GROWTH
Culture: NO GROWTH

## 2014-04-17 ENCOUNTER — Encounter (HOSPITAL_COMMUNITY): Payer: Self-pay

## 2014-04-17 ENCOUNTER — Emergency Department (HOSPITAL_COMMUNITY)
Admission: EM | Admit: 2014-04-17 | Discharge: 2014-04-18 | Disposition: A | Discharge status: Other Acute Inpatient Hospital | Payer: Medicare Other | Attending: Emergency Medicine | Admitting: Emergency Medicine

## 2014-04-17 ENCOUNTER — Emergency Department (HOSPITAL_COMMUNITY): Payer: Medicare Other

## 2014-04-17 DIAGNOSIS — Z7951 Long term (current) use of inhaled steroids: Secondary | ICD-10-CM | POA: Diagnosis not present

## 2014-04-17 DIAGNOSIS — Z3202 Encounter for pregnancy test, result negative: Secondary | ICD-10-CM | POA: Insufficient documentation

## 2014-04-17 DIAGNOSIS — K219 Gastro-esophageal reflux disease without esophagitis: Secondary | ICD-10-CM | POA: Diagnosis not present

## 2014-04-17 DIAGNOSIS — R9431 Abnormal electrocardiogram [ECG] [EKG]: Secondary | ICD-10-CM | POA: Diagnosis not present

## 2014-04-17 DIAGNOSIS — I509 Heart failure, unspecified: Secondary | ICD-10-CM | POA: Insufficient documentation

## 2014-04-17 DIAGNOSIS — J449 Chronic obstructive pulmonary disease, unspecified: Secondary | ICD-10-CM | POA: Insufficient documentation

## 2014-04-17 DIAGNOSIS — F209 Schizophrenia, unspecified: Secondary | ICD-10-CM | POA: Diagnosis not present

## 2014-04-17 DIAGNOSIS — Z720 Tobacco use: Secondary | ICD-10-CM | POA: Insufficient documentation

## 2014-04-17 DIAGNOSIS — F329 Major depressive disorder, single episode, unspecified: Secondary | ICD-10-CM | POA: Insufficient documentation

## 2014-04-17 DIAGNOSIS — M810 Age-related osteoporosis without current pathological fracture: Secondary | ICD-10-CM | POA: Diagnosis not present

## 2014-04-17 DIAGNOSIS — M1990 Unspecified osteoarthritis, unspecified site: Secondary | ICD-10-CM | POA: Diagnosis not present

## 2014-04-17 DIAGNOSIS — I10 Essential (primary) hypertension: Secondary | ICD-10-CM | POA: Insufficient documentation

## 2014-04-17 DIAGNOSIS — G20 Parkinson's disease: Secondary | ICD-10-CM | POA: Insufficient documentation

## 2014-04-17 DIAGNOSIS — D696 Thrombocytopenia, unspecified: Secondary | ICD-10-CM | POA: Diagnosis not present

## 2014-04-17 DIAGNOSIS — F0391 Unspecified dementia with behavioral disturbance: Secondary | ICD-10-CM | POA: Diagnosis not present

## 2014-04-17 DIAGNOSIS — Z046 Encounter for general psychiatric examination, requested by authority: Secondary | ICD-10-CM | POA: Diagnosis present

## 2014-04-17 DIAGNOSIS — Z79899 Other long term (current) drug therapy: Secondary | ICD-10-CM | POA: Diagnosis not present

## 2014-04-17 DIAGNOSIS — F03918 Unspecified dementia, unspecified severity, with other behavioral disturbance: Secondary | ICD-10-CM | POA: Diagnosis present

## 2014-04-17 LAB — COMPREHENSIVE METABOLIC PANEL
ALK PHOS: 78 U/L (ref 39–117)
ALT: 15 U/L (ref 0–35)
ANION GAP: 6 (ref 5–15)
AST: 20 U/L (ref 0–37)
Albumin: 3.8 g/dL (ref 3.5–5.2)
BILIRUBIN TOTAL: 0.5 mg/dL (ref 0.3–1.2)
BUN: 17 mg/dL (ref 6–23)
CO2: 29 mmol/L (ref 19–32)
Calcium: 9.4 mg/dL (ref 8.4–10.5)
Chloride: 104 mmol/L (ref 96–112)
Creatinine, Ser: 1.02 mg/dL (ref 0.50–1.10)
GFR calc non Af Amer: 53 mL/min — ABNORMAL LOW (ref >90)
GFR, EST AFRICAN AMERICAN: 62 mL/min — AB (ref >90)
Glucose, Bld: 91 mg/dL (ref 70–99)
Potassium: 4.2 mmol/L (ref 3.5–5.1)
Sodium: 139 mmol/L (ref 135–145)
Total Protein: 7.9 g/dL (ref 6.0–8.3)

## 2014-04-17 LAB — SALICYLATE LEVEL

## 2014-04-17 LAB — URINALYSIS, ROUTINE W REFLEX MICROSCOPIC
Bilirubin Urine: NEGATIVE
GLUCOSE, UA: NEGATIVE mg/dL
Hgb urine dipstick: NEGATIVE
KETONES UR: NEGATIVE mg/dL
LEUKOCYTES UA: NEGATIVE
Nitrite: NEGATIVE
PROTEIN: NEGATIVE mg/dL
Specific Gravity, Urine: 1.008 (ref 1.005–1.030)
Urobilinogen, UA: 0.2 mg/dL (ref 0.0–1.0)
pH: 5.5 (ref 5.0–8.0)

## 2014-04-17 LAB — RAPID URINE DRUG SCREEN, HOSP PERFORMED
AMPHETAMINES: NOT DETECTED
Barbiturates: NOT DETECTED
Benzodiazepines: NOT DETECTED
COCAINE: NOT DETECTED
OPIATES: NOT DETECTED
Tetrahydrocannabinol: NOT DETECTED

## 2014-04-17 LAB — ETHANOL: Alcohol, Ethyl (B): <5 mg/dL (ref 0–9)

## 2014-04-17 LAB — CBC
HEMATOCRIT: 39.5 % (ref 36.0–46.0)
Hemoglobin: 12.7 g/dL (ref 12.0–15.0)
MCH: 30.9 pg (ref 26.0–34.0)
MCHC: 32.2 g/dL (ref 30.0–36.0)
MCV: 96.1 fL (ref 78.0–100.0)
Platelets: 239 10*3/uL (ref 150–400)
RBC: 4.11 MIL/uL (ref 3.87–5.11)
RDW: 13.2 % (ref 11.5–15.5)
WBC: 5.7 10*3/uL (ref 4.0–10.5)

## 2014-04-17 LAB — PREGNANCY, URINE: Preg Test, Ur: NEGATIVE

## 2014-04-17 LAB — ACETAMINOPHEN LEVEL: Acetaminophen (Tylenol), Serum: <10 ug/mL — ABNORMAL LOW (ref 10–30)

## 2014-04-17 MED ORDER — HALOPERIDOL 2 MG PO TABS
2.0000 mg | ORAL_TABLET | Freq: Four times a day (QID) | ORAL | Status: DC | PRN
Start: 2014-04-17 — End: 2014-04-18
  Administered 2014-04-18: 2 mg via ORAL
  Filled 2014-04-17: qty 1

## 2014-04-17 MED ORDER — PSYLLIUM 95 % PO PACK
1.0000 | PACK | Freq: Every day | ORAL | Status: DC
  Filled 2014-04-17: qty 1

## 2014-04-17 MED ORDER — LORAZEPAM 2 MG/ML IJ SOLN
1.0000 mg | Freq: Once | INTRAMUSCULAR | Status: AC
  Administered 2014-04-17: 1 mg via INTRAMUSCULAR
  Filled 2014-04-17: qty 1

## 2014-04-17 MED ORDER — FLUTICASONE PROPIONATE 50 MCG/ACT NA SUSP
2.0000 | Freq: Every day | NASAL | Status: DC
  Filled 2014-04-17: qty 16

## 2014-04-17 MED ORDER — ALBUTEROL SULFATE HFA 108 (90 BASE) MCG/ACT IN AERS: 2.0000 | INHALATION_SPRAY | Freq: Four times a day (QID) | RESPIRATORY_TRACT | Status: DC

## 2014-04-17 MED ORDER — MOMETASONE FURO-FORMOTEROL FUM 200-5 MCG/ACT IN AERO
2.0000 | INHALATION_SPRAY | Freq: Two times a day (BID) | RESPIRATORY_TRACT | Status: DC
  Administered 2014-04-18: 2 via RESPIRATORY_TRACT
  Filled 2014-04-17: qty 8.8

## 2014-04-17 MED ORDER — FERROUS SULFATE 325 (65 FE) MG PO TABS
325.0000 mg | ORAL_TABLET | Freq: Two times a day (BID) | ORAL | Status: DC
  Administered 2014-04-18: 325 mg via ORAL
  Filled 2014-04-17 (×3): qty 1

## 2014-04-17 MED ORDER — LISINOPRIL 10 MG PO TABS
10.0000 mg | ORAL_TABLET | Freq: Every day | ORAL | Status: DC
  Filled 2014-04-17: qty 1

## 2014-04-17 MED ORDER — PANTOPRAZOLE SODIUM 40 MG PO TBEC
40.0000 mg | DELAYED_RELEASE_TABLET | Freq: Every day | ORAL | Status: DC
  Filled 2014-04-17: qty 1

## 2014-04-17 MED ORDER — MONTELUKAST SODIUM 10 MG PO TABS
10.0000 mg | ORAL_TABLET | Freq: Every day | ORAL | Status: DC
Start: 2014-04-18 — End: 2014-04-18
  Filled 2014-04-17: qty 1

## 2014-04-17 MED ORDER — TRAZODONE HCL 100 MG PO TABS: 100.0000 mg | ORAL_TABLET | Freq: Every evening | ORAL | Status: DC | PRN

## 2014-04-17 MED ORDER — FUROSEMIDE 40 MG PO TABS: 20.0000 mg | ORAL_TABLET | Freq: Every day | ORAL | Status: DC | PRN

## 2014-04-17 MED ORDER — DOCUSATE SODIUM 100 MG PO CAPS
100.0000 mg | ORAL_CAPSULE | Freq: Every day | ORAL | Status: DC
  Filled 2014-04-17: qty 1

## 2014-04-17 NOTE — ED Notes (Signed)
Son, Oletha Blend contact information: [phone], or [phone]

## 2014-04-17 NOTE — Progress Notes (Signed)
Patient noted to be IVC'd for aggressive behavior from [facility].  Patient listed as having Medicare insurance with pcp as Dr.Henry Trip.

## 2014-04-17 NOTE — ED Notes (Signed)
RN again attempting to speak to pt after son left the room. Pt continues to be paranoid and angry. Pt states "Miranda Ballard are trying to take out my heart". Pt uncooperative with staff.

## 2014-04-17 NOTE — BH Assessment (Signed)
Pt from assisted living presenting to ED under IVC due to increasing aggression. She has been dismissed from assisted living and is seeking placement.   Assessment to commence shortly.    Lear Ng, [facility] Triage Specialist 04/17/2014 8:30 PM

## 2014-04-17 NOTE — ED Notes (Signed)
Pt remains labile and refusing to give urine or to have EKG done by staff. Medication given for agitation as ordered by MD.

## 2014-04-17 NOTE — ED Notes (Signed)
Bed: NL97 Expected date:  Expected time:  Means of arrival:  Comments: [facility]

## 2014-04-17 NOTE — ED Notes (Signed)
Dr. Wyvonnia Dusky in room to assess patient. No s/s of distress noted at this time. Respirations regular and unlabored.

## 2014-04-17 NOTE — BH Assessment (Signed)
Attempted to assess pt. She refused to participate, was mostly incoherent. She yelled "get out of my damn face." Attempted to call son for collateral information. Left voicemail requesting a call back.   Lear Ng, [facility] Triage Specialist 04/17/2014 9:04 PM

## 2014-04-17 NOTE — ED Notes (Signed)
Bed: [facility] Expected date:  Expected time:  Means of arrival:  Comments: tr3

## 2014-04-17 NOTE — ED Notes (Signed)
Pt to CT via stretcher. Pt calm, able to follow commands. Respirations regular and unlabored, skin warm and dry.

## 2014-04-17 NOTE — ED Notes (Addendum)
EKG repeated per Dr. Wyvonnia Dusky request.

## 2014-04-17 NOTE — ED Notes (Addendum)
Pt labile, agitated and screaming at staff while attempting to obtain an EKG. Pt's son in room with pt. Pt refusing urine and screaming for all staff to leave the room. Pt unable to be redirected at this time.

## 2014-04-17 NOTE — BH Assessment (Addendum)
Tele Assessment Note   Miranda Ballard is an 74 y.o [place] American female with know hx of schizophrenia presenting to ED under IVC from assisted living facility due to increasing paranoia and aggression.   Per IVC: She is a danger to herself and or others. She is under the care of a assisted care living facility doctor who recommended involuntary commitment. She constantly hits herself. SHe has now begun aggressive behavior toward other residents. She is also combative with her son. She diagnosis is schizophrenia and taking threee psychotic related medicines. She is delusional and hearing voices. She thinks she is pregnant with god's baby. She has been committed before at [facility].   At time of assessment pt was irritable. She made a comment about her son that was mostly incoherent. Then yelled for this writer to "get out of my damn face." Information was obtained from ED notes and from telephone conversation with her son Miranda Ballard, who is her guardian.   Son reports pt has become increasingly aggressive and was given notice on March 1 that she needs to find a new residence. Son reports this is the second time this has happened. He feels pt needs placement in a mental health group home. He would like her placed near to him in [place], [place], or [place] so he can continue visits. Son reports pt is consumed with her paranoid delusions that she is too distracted to attend to other details. She is fearful and thinks people are out to get her. Son denies hx of SI, HI, or SA. He reports pt has been hitting herself and leaving marks on her face from doing so. He denies pt has hx of abuse but reports is has been hard on her emotionally as she has never really fit in well with others. Her reports pt was misdiagnosed with parkinson's but states she does have tremors and difficulty communicating what she wants to say at times.   Pt has been inpt in 2015 and 3-4 times in the 70s and 80s. Family hx  is unknown.  295.90 Schizophrenia Axis II: Deferred Axis III:  Past Medical History  Diagnosis Date  . Hypertension   . COPD (chronic obstructive pulmonary disease)   . Arthritis   . Schizophrenia   . Osteoporosis   . GERD (gastroesophageal reflux disease)   . Peripheral vascular disease   . Glaucoma   . 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 EF 55% with normal wall motion. Mild-moderate MR; moderate TR, RV SV greater than 60 mmHg. (Echo from December 2013 revealed PA pressures of roughly 30-40 mmHg. The consideration was probable diastolic dysfunction related elevated pulmonary pressures.)  . Incontinence   . Depression   . Parkinson's disease   . CHF (congestive heart failure)   . Thrombocytopenia   . Dysphasia    Axis IV: housing problems Axis V: 41-50 serious symptoms  Past Medical History:  Past Medical History  Diagnosis Date  . Hypertension   . COPD (chronic obstructive pulmonary disease)   . Arthritis   . Schizophrenia   . Osteoporosis   . GERD (gastroesophageal reflux disease)   . Peripheral vascular disease   . Glaucoma   . 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 EF 55% with normal wall motion. Mild-moderate MR; moderate TR, RV SV greater than 60 mmHg. (Echo from December 2013 revealed PA pressures of roughly 30-40 mmHg.  The consideration was probable diastolic dysfunction related elevated pulmonary pressures.)  . Incontinence   . Depression   . Parkinson's disease   . CHF (congestive heart failure)   . Thrombocytopenia   . Dysphasia     History reviewed. No pertinent past surgical history.  Family History:  Family History  Problem Relation Age of Onset  . Cancer Mother   . Cancer Father   . Cancer Sister     Social History:  reports that she has been smoking Cigarettes.  She has been smoking about 0.50 packs per day. She does not have any  smokeless tobacco history on file. She reports that she does not drink alcohol or use illicit drugs.  Additional Social History:  Alcohol / Drug Use Pain Medications: SEE PTA Prescriptions: SEE PTA Over the Counter: SEE PTA History of alcohol / drug use?: No history of alcohol / drug abuse Longest period of sobriety (when/how long): NA Negative Consequences of Use:  (NA) Withdrawal Symptoms:  (NA)  CIWA: CIWA-Ar BP: 160/91 mmHg Pulse Rate: 79 COWS:    PATIENT STRENGTHS: (choose at least two) Supportive family/friends  OP provider, medication compliant   Allergies: No Known Allergies  Home Medications:  (Not in a hospital admission)  OB/GYN Status:  No LMP recorded. Patient is postmenopausal.  General Assessment Data [facility] of [facility]: [facility] Is this a Tele or Face-to-Face Assessment?: Face-to-Face Is this an Initial Assessment or a Re-assessment for this encounter?: Initial Assessment Living Arrangements:  (assisted living, March 1 given 30 day notice to move) Can pt return to current living arrangement?: Yes Admission Status: Involuntary Is patient capable of signing voluntary admission?: No Transfer from: [facility] Home Referral Source: Other ([facility] staff)     [place] Living Arrangements:  (assisted living, March 1 given 30 day notice to move) Name of Psychiatrist: Dr. Lavina Hamman Name of Therapist: none  Education Status Is patient currently in school?: No Current Grade: NA Highest grade of school patient has completed: 97 Name of school: NA Contact person: son Miranda Ballard   Risk to self with the past 6 months Suicidal Ideation: No Suicidal Intent: No Is patient at risk for suicide?: No Suicidal Plan?: No Access to Means: No What has been your use of drugs/alcohol within the last 12 months?: none Previous Attempts/Gestures: No How many times?: 0 Other Self Harm Risks: hits herself Triggers for Past Attempts: None known Intentional Self  Injurious Behavior: Bruising Comment - Self Injurious Behavior: "hits herself a lot" per son Miranda Ballard Family Suicide History: No Recent stressful life event(s): Other (Comment) Persecutory voices/beliefs?: Yes Depression: Yes Depression Symptoms: Despondent, Tearfulness, Feeling angry/irritable Substance abuse history and/or treatment for substance abuse?: No Suicide prevention information given to non-admitted patients: Not applicable  Risk to Others within the past 6 months Homicidal Ideation: No Thoughts of Harm to Others: No Current Homicidal Intent: No Current Homicidal Plan: No Access to Homicidal Means: No Identified Victim: none History of harm to others?: Yes Assessment of Violence: On admission Violent Behavior Description: combative with staff Does patient have access to weapons?: No Criminal Charges Pending?: No Does patient have a court date: No  Psychosis Hallucinations:  (UTA) Delusions: Persecutory, Somatic ("believes people are out to get her" thought pregnany)  Mental Status Report Appear/Hygiene: Unremarkable Eye Contact: Unable to Assess Motor Activity: Agitation Speech: Incoherent, Loud, Aggressive Level of Consciousness: Alert Mood: Irritable, Fearful Affect:  (conistent with thought content) Anxiety Level: Severe Thought Processes: Unable to Assess Judgement: Impaired Orientation:  Unable to assess Obsessive Compulsive Thoughts/Behaviors: None  Cognitive Functioning Concentration: Decreased Memory: Unable to Assess IQ: Average Insight: Poor Impulse Control: Poor Appetite: Good Weight Loss: 0 Weight Gain: 0 Sleep: Unable to Assess Total Hours of Sleep:  (unknown) Vegetative Symptoms: Unable to Assess  ADLScreening [facility] Assessment Services) Patient's cognitive ability adequate to safely complete daily activities?: No Patient able to express need for assistance with ADLs?: No Independently performs ADLs?:  (unknown)  Prior Inpatient  Therapy Prior Inpatient Therapy: Yes Prior Therapy Dates: may 2015, and in 16s and 80s Prior Therapy Facilty/Provider(s): [place], [facility]  Reason for Treatment: schizophrenia  Prior Outpatient Therapy Prior Outpatient Therapy: Yes Prior Therapy Dates: current  Prior Therapy Facilty/Provider(s): [facility]  Reason for Treatment: schizophrenia  ADL Screening (condition at time of admission) Patient's cognitive ability adequate to safely complete daily activities?: No Is the patient deaf or have difficulty hearing?: No Does the patient have difficulty seeing, even when wearing glasses/contacts?: No Does the patient have difficulty concentrating, remembering, or making decisions?: Yes Patient able to express need for assistance with ADLs?: No Does the patient have difficulty dressing or bathing?: Yes Independently performs ADLs?:  (unknown) Does the patient have difficulty walking or climbing stairs?: No Weakness of Legs: None Weakness of Arms/Hands: None  Home Assistive Devices/Equipment Home Assistive Devices/Equipment: None    Abuse/Neglect Assessment (Assessment to be complete while patient is alone) Physical Abuse: Denies Verbal Abuse: Denies Sexual Abuse: Denies Exploitation of patient/patient's resources: Denies Self-Neglect: Denies Values / Beliefs Cultural Requests During Hospitalization: None Spiritual Requests During Hospitalization: None   Advance Directives (For Healthcare) Does patient have an advance directive?: Yes Type of Advance Directive: Healthcare Power of Oneta Rack (Son is guardian )    Additional Information 1:1 In Past 12 Months?: No CIRT Risk: Yes Elopement Risk: No Does patient have medical clearance?: Yes     Disposition:  Per Patriciaann Clan, PA pt meets geropsyc criteria. TTS to seek placement. Informed Dr. Wyvonnia Dusky of plan to seek placement.  Lear Ng, [facility] Triage Specialist 04/17/2014 10:13  PM  Disposition Initial Assessment Completed for this Encounter: Yes  Toniette Devera M 04/17/2014 10:03 PM

## 2014-04-17 NOTE — ED Provider Notes (Addendum)
CSN: [id number]     Arrival date & time 04/17/14  1810 History   First MD Initiated Contact with Patient 04/17/14 1853     Chief Complaint  Patient presents with  . IVC   . Aggressive Behavior     (Consider location/radiation/quality/duration/timing/severity/associated sxs/prior Treatment) HPI Comments: Patient from assisted living facility after being IVC by police. She's had a increasingly episodes of agitation and violent behavior over the past several days. She's been formally dispense from her nursing home. Patient says looking at placement options. Patient has become violent and aggressive towards staff and other people at the nursing home. Per notes she was complaining that she was pregnant and delivered a baby on March 5. Patient's son at bedside is unaware of any physical complaints other than her intermittent coughing from her COPD. Negative chest x-ray was done on March 10. No recent fever, chills, nausea, vomiting, chest pain. Patient denies any pain complaints. States compliance with medications.  The history is provided by the patient and a caregiver. The history is limited by the condition of the patient.    Past Medical History  Diagnosis Date  . Hypertension   . COPD (chronic obstructive pulmonary disease)   . Arthritis   . Schizophrenia   . Osteoporosis   . GERD (gastroesophageal reflux disease)   . Peripheral vascular disease   . Glaucoma   . 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 EF 55% with normal wall motion. Mild-moderate MR; moderate TR, RV SV greater than 60 mmHg. (Echo from December 2013 revealed PA pressures of roughly 30-40 mmHg. The consideration was probable diastolic dysfunction related elevated pulmonary pressures.)  . Incontinence   . Depression   . Parkinson's disease   . CHF (congestive heart failure)   . Thrombocytopenia   . Dysphasia    History reviewed. No pertinent past surgical  history. Family History  Problem Relation Age of Onset  . Cancer Mother   . Cancer Father   . Cancer Sister    History  Substance Use Topics  . Smoking status: Current Every Day Smoker -- 0.50 packs/day    Types: Cigarettes  . Smokeless tobacco: Not on file  . Alcohol Use: No   OB History    No data available     Review of Systems  Unable to perform ROS: Psychiatric disorder      Allergies  Review of patient's allergies indicates no known allergies.  Home Medications   Prior to Admission medications   Medication Sig Start Date End Date Taking? Authorizing Provider  albuterol (PROVENTIL HFA;VENTOLIN HFA) 108 (90 BASE) MCG/ACT inhaler Inhale 2 puffs into the lungs every 6 (six) hours. Scheduled per nursing home mar.For wheezing 01/26/12  Yes Bonnielee Haff, MD  docusate sodium 100 MG CAPS Take 100 mg by mouth 2 (two) times daily. Patient taking differently: Take 100 mg by mouth daily.  01/26/12  Yes Bonnielee Haff, MD  ferrous sulfate 325 (65 FE) MG tablet Take 325 mg by mouth 2 (two) times daily with a meal.   Yes Historical Provider, MD  fluticasone (VERAMYST) 27.5 MCG/SPRAY nasal spray Place 2 sprays into the nose daily.   Yes Historical Provider, MD  fluticasone-salmeterol (ADVAIR HFA) 230-21 MCG/ACT inhaler Inhale 2 puffs into the lungs 2 (two) times daily.   Yes Historical Provider, MD  furosemide (LASIX) 20 MG tablet Take 20 mg by mouth daily as needed for fluid or edema.    Yes  Historical Provider, MD  haloperidol (HALDOL) 10 MG tablet Take 5 mg by mouth every 6 (six) hours as needed.   Yes Historical Provider, MD  haloperidol decanoate (HALDOL DECANOATE) 50 MG/ML injection Inject 50 mg into the muscle every 28 (twenty-eight) days.   Yes Historical Provider, MD  lisinopril (PRINIVIL,ZESTRIL) 10 MG tablet Take 10 mg by mouth daily.   Yes Historical Provider, MD  loperamide (IMODIUM) 2 MG capsule Take 2 mg by mouth as needed for diarrhea or loose stools.   Yes Historical  Provider, MD  Melatonin 5 MG TABS Take 10 mg by mouth at bedtime.   Yes Historical Provider, MD  montelukast (SINGULAIR) 10 MG tablet Take 1 tablet (10 mg total) by mouth daily. 01/26/12  Yes Bonnielee Haff, MD  omeprazole (PRILOSEC) 20 MG capsule Take 1 capsule (20 mg total) by mouth daily. 01/26/12  Yes Bonnielee Haff, MD  Oyster Shell 500 MG TABS Take 1 tablet by mouth daily.   Yes Historical Provider, MD  psyllium (REGULOID) 0.52 G capsule Take 0.52 g by mouth daily.   Yes Historical Provider, MD  traZODone (DESYREL) 100 MG tablet Take 100 mg by mouth at bedtime as needed for sleep.   Yes Historical Provider, MD  Calcium Carbonate-Vitamin D (CALCIUM 600+D) 600-400 MG-UNIT per tablet Take 1 tablet by mouth 2 (two) times daily. Patient not taking: Reported on 04/17/2014 01/26/12   Bonnielee Haff, MD  fluticasone [facility]) 50 MCG/ACT nasal spray Place 2 sprays into the nose daily. scheduled Patient not taking: Reported on 04/17/2014 01/26/12   Bonnielee Haff, MD  Fluticasone-Salmeterol (ADVAIR) 250-50 MCG/DOSE AEPB Inhale 1 puff into the lungs every 12 (twelve) hours. scheduled Patient not taking: Reported on 04/17/2014 01/26/12   Bonnielee Haff, MD  loratadine (CLARITIN) 10 MG tablet Take 1 tablet (10 mg total) by mouth daily. scheduled Patient not taking: Reported on 04/17/2014 01/26/12   Bonnielee Haff, MD  tolterodine (DETROL LA) 4 MG 24 hr capsule Take 1 capsule (4 mg total) by mouth at bedtime. Patient not taking: Reported on 04/17/2014 01/26/12   Bonnielee Haff, MD   BP 160/91 mmHg  Pulse 79  Temp(Src) 98.5 F (36.9 C) (Oral)  Resp 20  SpO2 97% Physical Exam  Constitutional: She appears well-developed and well-nourished. No distress.  Oriented 2, answers questions appropriately  HENT:  Head: Normocephalic and atraumatic.  Mouth/Throat: Oropharynx is clear and moist. No oropharyngeal exudate.  Eyes: Conjunctivae and EOM are normal. Pupils are equal, round, and reactive to light.   Neck: Normal range of motion. Neck supple.  Cardiovascular: Normal rate and normal heart sounds.   Pulmonary/Chest: Effort normal. No respiratory distress.  Abdominal: There is no tenderness. There is no rebound and no guarding.  Musculoskeletal: Normal range of motion. She exhibits no edema or tenderness.  Neurological: She is alert. No cranial nerve deficit. She exhibits normal muscle tone.  Cranial nerves II-12 intact, 5/5 strength throughout.  Skin: Skin is warm.    ED Course  Procedures (including critical care time) Labs Review Labs Reviewed  ACETAMINOPHEN LEVEL - Abnormal; Notable for the following:    Acetaminophen (Tylenol), Serum <10.0 (*)    All other components within normal limits  COMPREHENSIVE METABOLIC PANEL - Abnormal; Notable for the following:    GFR calc non Af Amer 53 (*)    GFR calc Af Amer 62 (*)    All other components within normal limits  CBC  ETHANOL  SALICYLATE LEVEL  URINE RAPID DRUG SCREEN (HOSP PERFORMED)  PREGNANCY,  URINE  URINALYSIS, ROUTINE W REFLEX MICROSCOPIC  TROPONIN I  TROPONIN I  TROPONIN I    Imaging Review Dg Chest 2 View  04/17/2014   CLINICAL DATA:  Acute onset of aggressive behavior. Initial encounter.  EXAM: CHEST  2 VIEW  COMPARISON:  Chest radiograph performed 04/12/2014  FINDINGS: The lungs are well-aerated. Vague density at the right midlung zone is thought to reflect overlying soft tissues. There is no evidence of focal opacification, pleural effusion or pneumothorax.  The heart is borderline enlarged. No acute osseous abnormalities are seen. Small bilateral cervical ribs are noted.  IMPRESSION: 1. No acute cardiopulmonary process seen. 2. Borderline cardiomegaly. 3. Small bilateral cervical ribs noted.   Electronically Signed   By: Garald Balding M.D.   On: 04/17/2014 23:05   Ct Head Wo Contrast  04/17/2014   CLINICAL DATA:  Patient hitting herself; aggression. Delusions and hearing voices. Initial encounter.  EXAM: CT HEAD  WITHOUT CONTRAST  TECHNIQUE: Contiguous axial images were obtained from the base of the skull through the vertex without intravenous contrast.  COMPARISON:  None.  FINDINGS: There is no evidence of acute infarction, mass lesion, or intra- or extra-axial hemorrhage on CT.  Prominence of the ventricles and sulci reflects mild cortical volume loss. Mild cerebellar atrophy is noted. Scattered periventricular white matter change likely reflects small vessel ischemic microangiopathy.  The brainstem and fourth ventricle are within normal limits. The basal ganglia are unremarkable in appearance. The cerebral hemispheres demonstrate grossly normal gray-white differentiation. No mass effect or midline shift is seen.  There is no evidence of fracture; visualized osseous structures are unremarkable in appearance. The orbits are within normal limits. There is opacification of the right frontal sinus. The remaining paranasal sinuses and mastoid air cells are well-aerated. No significant soft tissue abnormalities are seen.  IMPRESSION: 1. No acute intracranial pathology seen on CT. 2. Mild cortical volume loss and scattered small vessel ischemic microangiopathy. 3. Opacification of the right frontal sinus.   Electronically Signed   By: Garald Balding M.D.   On: 04/17/2014 23:03     EKG Interpretation   Date/Time:  Wednesday April 18 2014 00:27:06 EDT Ventricular Rate:  42 PR Interval:  123 QRS Duration: 83 QT Interval:  558 QTC Calculation: 466 R Axis:   27 Text Interpretation:  Sinus rhythm Ventricular trigeminy Probable left  ventricular hypertrophy Anterior Q waves, possibly due to LVH Abnrm T,  consider ischemia, anterolateral lds biphasic T waves anteriorly d/w Dr.  Haroldine Laws Confirmed by Wyvonnia Dusky  MD, Arleen Bar [phone]) on 04/18/2014 12:51:50  AM      MDM   Final diagnoses:  Schizophrenia, unspecified type  Abnormal EKG   Patient from assisted living facility with aggressive behavior. Per son at  bedside, patient with aggressive behavior and has been asked to leave nursing facility.  UA negative, CT head negative. CXR negative. Labs reassuring.  EKG with biphasic T waves anteriorly with upsloping ST segments. Multiple EKGs discussed with Dr. Haroldine Laws. He reviewed EKG does not feel it represents Wellen's syndrome but is concerning for injury current.  Troponin negative. Patient denies chest pain but history is unreliable.  Repeat EKGs are similar to previous. Dr. Haroldine Laws recommends repeat troponin.  Repeat troponin pending for 130am. Dr. Randal Buba aware.  Patient will need placement per [facility].  ED ECG REPORT   Date: 04/17/2014  Rate: 64  Rhythm: normal sinus rhythm  QRS Axis: normal  Intervals: normal  ST/T Wave abnormalities: nonspecific ST/T changes  Conduction Disutrbances:none  Narrative Interpretation: biphasic T wave in lead v3, v4  Old EKG Reviewed: unchanged  I have personally reviewed the EKG tracing and agree with the computerized printout as noted.     Ezequiel Essex, MD 04/18/14 0111  Ezequiel Essex, MD 04/18/14 [phone]

## 2014-04-17 NOTE — ED Notes (Signed)
Pt taken to restroom to change, pt unable to change herself, had soiled her depends.  Pt was changed into scrubs after being cleaned. Unable to collect UDS

## 2014-04-17 NOTE — ED Notes (Signed)
Pt presents w/ [employer] after being IVC'd by son.  Per IVC paperwork, Pt is a danger to self and others.  Pt lives in an assisted living facility which recommended she be IVC'd.  Pt is constantly hitting self and has become aggressive toward other residents and her son.  Hx of schizophrenia.  Pt is delusional, hearing voices, and thinks she is pregnant w/ God's baby.   [employer] reports Pt was yelling and threatening other residents as they were leaving the facility.

## 2014-04-18 DIAGNOSIS — F03918 Unspecified dementia, unspecified severity, with other behavioral disturbance: Secondary | ICD-10-CM | POA: Diagnosis present

## 2014-04-18 DIAGNOSIS — F0391 Unspecified dementia with behavioral disturbance: Secondary | ICD-10-CM

## 2014-04-18 DIAGNOSIS — F209 Schizophrenia, unspecified: Secondary | ICD-10-CM | POA: Diagnosis not present

## 2014-04-18 LAB — TROPONIN I
Troponin I: 0.03 ng/mL (ref <0.031)
Troponin I: 0.03 ng/mL (ref <0.031)
Troponin I: <0.03 ng/mL

## 2014-04-18 MED ORDER — LORAZEPAM 1 MG PO TABS: 1.0000 mg | ORAL_TABLET | Freq: Once | ORAL | Status: DC | PRN

## 2014-04-18 MED ORDER — LORAZEPAM 2 MG/ML IJ SOLN: 1.0000 mg | Freq: Once | INTRAMUSCULAR | Status: DC | PRN

## 2014-04-18 MED ORDER — LORAZEPAM 1 MG PO TABS: 1.0000 mg | ORAL_TABLET | Freq: Once | ORAL | Status: DC

## 2014-04-18 NOTE — ED Notes (Signed)
[facility] arrived to transfer pt to [facility]

## 2014-04-18 NOTE — Progress Notes (Signed)
Pt discussed in [facility] progression meeting Recommendation for geripsych inpatient placement pending responses from referrals.  Pt called [facility] NP/PA inappropriate names "Bitch"   Cm noted pt to refuse lab work for 04/18/14 Pt informed [facility] ED Lab staff she "going to kill you"  TCU RN checked with EDP about labs Labs later obtained by IV team RN with pt having a delayed reaction to pain at end of lab draw but lab was obtained

## 2014-04-18 NOTE — BH Assessment (Signed)
Per Nunzio Cory patient accepted to [facility] by Dr. Geanie Kenning. Nursing report # [phone]. Sheriff to transport patient to the facility. Son Oletha Blend is guardian and his contact information is [phone], or [phone]. Writer has contacted son and left voice mails on both phone numbers listed.
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
## 2014-04-18 NOTE — ED Notes (Signed)
Pt found incontinent.  Linens changed.  Peri care provided.  Pt agitated and continues to states demise of baby with cancer

## 2014-04-18 NOTE — BH Assessment (Signed)
Seeking placement. Referral sent to gero psyc facilities.  [place], [place] Triage Specialist 04/18/2014 12:37 AM

## 2014-04-18 NOTE — ED Notes (Signed)
Patient awake, and remains delusional yelling "I feel the baby's head now! I'm having a boy". Pt with her legs spread open and breathing as if in labor. No distress noted.

## 2014-04-19 ENCOUNTER — Ambulatory Visit (HOSPITAL_COMMUNITY): Payer: Medicare Other

## 2014-11-11 ENCOUNTER — Emergency Department (HOSPITAL_COMMUNITY): Payer: Medicare Other

## 2014-11-11 ENCOUNTER — Emergency Department (HOSPITAL_COMMUNITY)
Admission: EM | Admit: 2014-11-11 | Discharge: 2014-11-20 | Disposition: A | Discharge status: Home / Self Care | Payer: Medicare Other | Attending: Emergency Medicine | Admitting: Emergency Medicine

## 2014-11-11 ENCOUNTER — Encounter (HOSPITAL_COMMUNITY): Payer: Self-pay | Admitting: Emergency Medicine

## 2014-11-11 DIAGNOSIS — G20 Parkinson's disease: Secondary | ICD-10-CM | POA: Insufficient documentation

## 2014-11-11 DIAGNOSIS — Z7951 Long term (current) use of inhaled steroids: Secondary | ICD-10-CM | POA: Diagnosis not present

## 2014-11-11 DIAGNOSIS — M1990 Unspecified osteoarthritis, unspecified site: Secondary | ICD-10-CM | POA: Diagnosis not present

## 2014-11-11 DIAGNOSIS — Z8669 Personal history of other diseases of the nervous system and sense organs: Secondary | ICD-10-CM | POA: Insufficient documentation

## 2014-11-11 DIAGNOSIS — Z79899 Other long term (current) drug therapy: Secondary | ICD-10-CM | POA: Insufficient documentation

## 2014-11-11 DIAGNOSIS — Z720 Tobacco use: Secondary | ICD-10-CM | POA: Insufficient documentation

## 2014-11-11 DIAGNOSIS — F329 Major depressive disorder, single episode, unspecified: Secondary | ICD-10-CM | POA: Insufficient documentation

## 2014-11-11 DIAGNOSIS — I10 Essential (primary) hypertension: Secondary | ICD-10-CM | POA: Insufficient documentation

## 2014-11-11 DIAGNOSIS — J449 Chronic obstructive pulmonary disease, unspecified: Secondary | ICD-10-CM | POA: Insufficient documentation

## 2014-11-11 DIAGNOSIS — F918 Other conduct disorders: Secondary | ICD-10-CM | POA: Insufficient documentation

## 2014-11-11 DIAGNOSIS — M810 Age-related osteoporosis without current pathological fracture: Secondary | ICD-10-CM | POA: Diagnosis not present

## 2014-11-11 DIAGNOSIS — F209 Schizophrenia, unspecified: Secondary | ICD-10-CM | POA: Diagnosis not present

## 2014-11-11 DIAGNOSIS — F03918 Unspecified dementia, unspecified severity, with other behavioral disturbance: Secondary | ICD-10-CM

## 2014-11-11 DIAGNOSIS — Z049 Encounter for examination and observation for unspecified reason: Secondary | ICD-10-CM

## 2014-11-11 DIAGNOSIS — R4689 Other symptoms and signs involving appearance and behavior: Secondary | ICD-10-CM | POA: Diagnosis not present

## 2014-11-11 DIAGNOSIS — F0391 Unspecified dementia with behavioral disturbance: Secondary | ICD-10-CM | POA: Diagnosis not present

## 2014-11-11 DIAGNOSIS — K219 Gastro-esophageal reflux disease without esophagitis: Secondary | ICD-10-CM | POA: Insufficient documentation

## 2014-11-11 DIAGNOSIS — I509 Heart failure, unspecified: Secondary | ICD-10-CM | POA: Diagnosis not present

## 2014-11-11 DIAGNOSIS — R4182 Altered mental status, unspecified: Secondary | ICD-10-CM | POA: Diagnosis present

## 2014-11-11 LAB — COMPREHENSIVE METABOLIC PANEL
ALBUMIN: 3.1 g/dL — AB (ref 3.5–5.0)
ALK PHOS: 79 U/L (ref 38–126)
ALT: 14 U/L (ref 14–54)
AST: 25 U/L (ref 15–41)
Anion gap: 7 (ref 5–15)
BILIRUBIN TOTAL: 0.6 mg/dL (ref 0.3–1.2)
BUN: 13 mg/dL (ref 6–20)
CALCIUM: 8.9 mg/dL (ref 8.9–10.3)
CO2: 28 mmol/L (ref 22–32)
Chloride: 101 mmol/L (ref 101–111)
Creatinine, Ser: 0.62 mg/dL (ref 0.44–1.00)
GFR calc Af Amer: >60 mL/min (ref >60)
GFR calc non Af Amer: >60 mL/min (ref >60)
GLUCOSE: 98 mg/dL (ref 65–99)
Potassium: 3.2 mmol/L — ABNORMAL LOW (ref 3.5–5.1)
Sodium: 136 mmol/L (ref 135–145)
TOTAL PROTEIN: 6.7 g/dL (ref 6.5–8.1)

## 2014-11-11 LAB — URINALYSIS, ROUTINE W REFLEX MICROSCOPIC
Bilirubin Urine: NEGATIVE
GLUCOSE, UA: NEGATIVE mg/dL
Hgb urine dipstick: NEGATIVE
KETONES UR: NEGATIVE mg/dL
NITRITE: POSITIVE — AB
PH: 5.5 (ref 5.0–8.0)
Protein, ur: NEGATIVE mg/dL
SPECIFIC GRAVITY, URINE: 1.02 (ref 1.005–1.030)
Urobilinogen, UA: 0.2 mg/dL (ref 0.0–1.0)

## 2014-11-11 LAB — CBC WITH DIFFERENTIAL/PLATELET
BASOS ABS: 0 10*3/uL (ref 0.0–0.1)
Basophils Relative: 0 %
EOS PCT: 1 %
Eosinophils Absolute: 0.1 10*3/uL (ref 0.0–0.7)
HEMATOCRIT: 30.3 % — AB (ref 36.0–46.0)
Hemoglobin: 10.4 g/dL — ABNORMAL LOW (ref 12.0–15.0)
Lymphocytes Relative: 22 %
Lymphs Abs: 1.2 10*3/uL (ref 0.7–4.0)
MCH: 32.3 pg (ref 26.0–34.0)
MCHC: 34.3 g/dL (ref 30.0–36.0)
MCV: 94.1 fL (ref 78.0–100.0)
MONO ABS: 0.9 10*3/uL (ref 0.1–1.0)
MONOS PCT: 17 %
Neutro Abs: 3.3 10*3/uL (ref 1.7–7.7)
Neutrophils Relative %: 60 %
PLATELETS: 227 10*3/uL (ref 150–400)
RBC: 3.22 MIL/uL — ABNORMAL LOW (ref 3.87–5.11)
RDW: 14.4 % (ref 11.5–15.5)
WBC: 5.5 10*3/uL (ref 4.0–10.5)

## 2014-11-11 LAB — I-STAT CG4 LACTIC ACID, ED: Lactic Acid, Venous: 1.1 mmol/L (ref 0.5–2.0)

## 2014-11-11 LAB — RAPID URINE DRUG SCREEN, HOSP PERFORMED
Amphetamines: NOT DETECTED
BARBITURATES: NOT DETECTED
BENZODIAZEPINES: POSITIVE — AB
COCAINE: NOT DETECTED
Opiates: NOT DETECTED
TETRAHYDROCANNABINOL: NOT DETECTED

## 2014-11-11 LAB — I-STAT CHEM 8, ED
BUN: 12 mg/dL (ref 6–20)
Calcium, Ion: 1.21 mmol/L (ref 1.13–1.30)
Chloride: 99 mmol/L — ABNORMAL LOW (ref 101–111)
Creatinine, Ser: 0.7 mg/dL (ref 0.44–1.00)
Glucose, Bld: 97 mg/dL (ref 65–99)
HEMATOCRIT: 34 % — AB (ref 36.0–46.0)
Hemoglobin: 11.6 g/dL — ABNORMAL LOW (ref 12.0–15.0)
POTASSIUM: 3.2 mmol/L — AB (ref 3.5–5.1)
Sodium: 137 mmol/L (ref 135–145)
TCO2: 25 mmol/L (ref 0–100)

## 2014-11-11 LAB — URINE MICROSCOPIC-ADD ON

## 2014-11-11 LAB — ETHANOL: Alcohol, Ethyl (B): <5 mg/dL (ref <5)

## 2014-11-11 LAB — CBG MONITORING, ED: GLUCOSE-CAPILLARY: 72 mg/dL (ref 65–99)

## 2014-11-11 LAB — AMMONIA: Ammonia: 22 umol/L (ref 9–35)

## 2014-11-11 MED ORDER — RISPERIDONE 0.5 MG PO TBDP
0.5000 mg | ORAL_TABLET | Freq: Two times a day (BID) | ORAL | Status: DC
  Administered 2014-11-11 – 2014-11-20 (×19): 0.5 mg via ORAL
  Filled 2014-11-11 (×21): qty 1

## 2014-11-11 MED ORDER — THIOTHIXENE 5 MG PO CAPS
5.0000 mg | ORAL_CAPSULE | Freq: Every day | ORAL | Status: DC
  Administered 2014-11-11: 5 mg via ORAL
  Filled 2014-11-11 (×2): qty 1

## 2014-11-11 MED ORDER — CALCIUM CARBONATE 1250 (500 CA) MG PO TABS
1.0000 | ORAL_TABLET | Freq: Every day | ORAL | Status: DC
  Administered 2014-11-12 – 2014-11-20 (×9): 500 mg via ORAL
  Filled 2014-11-11 (×11): qty 1

## 2014-11-11 MED ORDER — POTASSIUM CHLORIDE 20 MEQ/15ML (10%) PO SOLN
20.0000 meq | Freq: Every day | ORAL | Status: DC
  Administered 2014-11-11 – 2014-11-15 (×5): 20 meq via ORAL
  Filled 2014-11-11 (×5): qty 15

## 2014-11-11 MED ORDER — FOSFOMYCIN TROMETHAMINE 3 G PO PACK
3.0000 g | PACK | Freq: Once | ORAL | Status: AC
  Administered 2014-11-11: 3 g via ORAL
  Filled 2014-11-11: qty 3

## 2014-11-11 MED ORDER — LISINOPRIL 5 MG PO TABS
5.0000 mg | ORAL_TABLET | Freq: Every day | ORAL | Status: DC
  Administered 2014-11-12 – 2014-11-20 (×9): 5 mg via ORAL
  Filled 2014-11-11 (×10): qty 1

## 2014-11-11 MED ORDER — DONEPEZIL HCL 5 MG PO TABS
5.0000 mg | ORAL_TABLET | Freq: Every day | ORAL | Status: DC
  Administered 2014-11-11 – 2014-11-19 (×9): 5 mg via ORAL
  Filled 2014-11-11 (×10): qty 1

## 2014-11-11 MED ORDER — HALOPERIDOL LACTATE 5 MG/ML IJ SOLN
5.0000 mg | Freq: Once | INTRAMUSCULAR | Status: AC
Start: 2014-11-11 — End: 2014-11-11
  Administered 2014-11-11: 5 mg via INTRAMUSCULAR
  Filled 2014-11-11: qty 1

## 2014-11-11 MED ORDER — MONTELUKAST SODIUM 10 MG PO TABS
10.0000 mg | ORAL_TABLET | Freq: Every day | ORAL | Status: DC
  Administered 2014-11-11 – 2014-11-20 (×10): 10 mg via ORAL
  Filled 2014-11-11 (×10): qty 1

## 2014-11-11 MED ORDER — SODIUM CHLORIDE 0.9 % IV SOLN
1000.0000 mL | Freq: Once | INTRAVENOUS | Status: AC
  Administered 2014-11-11: 1000 mL via INTRAVENOUS

## 2014-11-11 MED ORDER — VITAMIN D3 25 MCG (1000 UNIT) PO TABS
1000.0000 [IU] | ORAL_TABLET | Freq: Every day | ORAL | Status: DC
  Administered 2014-11-12 – 2014-11-20 (×9): 1000 [IU] via ORAL
  Filled 2014-11-11 (×10): qty 1

## 2014-11-11 MED ORDER — MOMETASONE FURO-FORMOTEROL FUM 100-5 MCG/ACT IN AERO
2.0000 | INHALATION_SPRAY | Freq: Two times a day (BID) | RESPIRATORY_TRACT | Status: DC
  Administered 2014-11-11 – 2014-11-20 (×16): 2 via RESPIRATORY_TRACT
  Filled 2014-11-11: qty 8.8

## 2014-11-11 MED ORDER — CALCIUM CARBONATE-VITAMIN D 600-400 MG-UNIT PO TABS: 1.0000 | ORAL_TABLET | Freq: Two times a day (BID) | ORAL | Status: DC

## 2014-11-11 MED ORDER — PSYLLIUM 0.52 G PO CAPS: 0.5200 g | ORAL_CAPSULE | Freq: Every day | ORAL | Status: DC

## 2014-11-11 MED ORDER — ADULT MULTIVITAMIN W/MINERALS CH
1.0000 | ORAL_TABLET | Freq: Every day | ORAL | Status: DC
Start: 2014-11-12 — End: 2014-11-20
  Administered 2014-11-12 – 2014-11-20 (×9): 1 via ORAL
  Filled 2014-11-11 (×8): qty 1

## 2014-11-11 MED ORDER — CARBAMAZEPINE 200 MG PO TABS
200.0000 mg | ORAL_TABLET | Freq: Two times a day (BID) | ORAL | Status: DC
  Administered 2014-11-11 – 2014-11-20 (×18): 200 mg via ORAL
  Filled 2014-11-11 (×20): qty 1

## 2014-11-11 MED ORDER — ALPRAZOLAM 0.25 MG PO TABS
0.2500 mg | ORAL_TABLET | Freq: Three times a day (TID) | ORAL | Status: DC | PRN
  Administered 2014-11-11 – 2014-11-15 (×9): 0.25 mg via ORAL
  Filled 2014-11-11 (×9): qty 1

## 2014-11-11 MED ORDER — FERROUS SULFATE 325 (65 FE) MG PO TABS
325.0000 mg | ORAL_TABLET | Freq: Two times a day (BID) | ORAL | Status: DC
  Administered 2014-11-12 – 2014-11-20 (×17): 325 mg via ORAL
  Filled 2014-11-11 (×20): qty 1

## 2014-11-11 MED ORDER — THEOPHYLLINE ER 100 MG PO TB12
100.0000 mg | ORAL_TABLET | Freq: Two times a day (BID) | ORAL | Status: DC
  Administered 2014-11-11 – 2014-11-20 (×18): 100 mg via ORAL
  Filled 2014-11-11 (×19): qty 1

## 2014-11-11 MED ORDER — CALCIUM CARBONATE-VITAMIN D 500-200 MG-UNIT PO TABS
1.0000 | ORAL_TABLET | Freq: Two times a day (BID) | ORAL | Status: DC
  Filled 2014-11-11: qty 1

## 2014-11-11 MED ORDER — TRAZODONE HCL 50 MG PO TABS
25.0000 mg | ORAL_TABLET | Freq: Four times a day (QID) | ORAL | Status: DC | PRN
  Administered 2014-11-11 – 2014-11-13 (×3): 25 mg via ORAL
  Filled 2014-11-11 (×3): qty 1

## 2014-11-11 MED ORDER — DOCUSATE SODIUM 100 MG PO CAPS
100.0000 mg | ORAL_CAPSULE | Freq: Two times a day (BID) | ORAL | Status: DC
  Administered 2014-11-12 – 2014-11-15 (×7): 100 mg via ORAL
  Filled 2014-11-11 (×7): qty 1

## 2014-11-11 MED ORDER — SENNA 8.6 MG PO TABS
1.0000 | ORAL_TABLET | Freq: Two times a day (BID) | ORAL | Status: DC
  Administered 2014-11-12 – 2014-11-15 (×7): 8.6 mg via ORAL
  Filled 2014-11-11 (×7): qty 1

## 2014-11-11 MED ORDER — PSYLLIUM 95 % PO PACK
1.0000 | PACK | Freq: Every day | ORAL | Status: DC
  Administered 2014-11-12 – 2014-11-15 (×4): 1 via ORAL
  Filled 2014-11-11 (×6): qty 1

## 2014-11-11 MED ORDER — ACETAMINOPHEN 500 MG PO TABS
500.0000 mg | ORAL_TABLET | ORAL | Status: DC | PRN
  Administered 2014-11-17 – 2014-11-20 (×4): 500 mg via ORAL
  Filled 2014-11-11 (×4): qty 1

## 2014-11-11 NOTE — ED Notes (Signed)
Bed: YN82 Expected date: 11/11/14 Expected time: 10:08 AM Means of arrival: Ambulance Comments: Behavior/ combative 81m versed

## 2014-11-11 NOTE — ED Notes (Signed)
Son left phone Oletha Blend [phone]

## 2014-11-11 NOTE — ED Notes (Signed)
TTS at bedside.

## 2014-11-11 NOTE — Progress Notes (Signed)
Disposition CSW completed patient referrals for the following Geri-Psych facilities:  [place]  CSW will follow patient for placement needs.  [place] Disposition CSW [phone]
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
## 2014-11-11 NOTE — ED Notes (Signed)
Patient transported to CT 

## 2014-11-11 NOTE — ED Notes (Signed)
Pt awake. Verbally responsive. Resp even and unlabored. ABC's intact. No behavior problems noted. IV saline lock patent and intact. Son at bedside.

## 2014-11-11 NOTE — ED Notes (Signed)
Pt is extremely agitated at this time.  Meds given.  Unable to get EKG /

## 2014-11-11 NOTE — ED Notes (Signed)
Patient continues to talk to herself.  Unable to perform EKG or change patient into burgundy scrubs at present.  Will continue to reassess.

## 2014-11-11 NOTE — ED Notes (Addendum)
Report given Jana Half, RN in [place]. Pt taken to room 29.
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
## 2014-11-11 NOTE — ED Notes (Signed)
Patient continues to be agitated-yelling at staff. Medications held
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
## 2014-11-11 NOTE — ED Notes (Signed)
Resting quietly with eye closed. Easily arousable. Verbally responsive. Resp even and unlabored. ABC's intact. No behavior problems noted. SR on monitor. IV saline lock patent and intact. Son at bedside.

## 2014-11-11 NOTE — ED Notes (Signed)
Will perform vital signs when patient wakes up.

## 2014-11-11 NOTE — ED Notes (Signed)
Per Agricultural consultant, OK to leave pt in regular clothing for now.

## 2014-11-11 NOTE — ED Notes (Signed)
Patient now sleeping.

## 2014-11-12 DIAGNOSIS — R4689 Other symptoms and signs involving appearance and behavior: Secondary | ICD-10-CM | POA: Diagnosis present

## 2014-11-12 DIAGNOSIS — F209 Schizophrenia, unspecified: Secondary | ICD-10-CM | POA: Diagnosis not present

## 2014-11-12 MED ORDER — THIOTHIXENE 2 MG PO CAPS
2.0000 mg | ORAL_CAPSULE | Freq: Every day | ORAL | Status: DC
  Administered 2014-11-12: 2 mg via ORAL
  Filled 2014-11-12 (×2): qty 1

## 2014-11-12 NOTE — ED Notes (Signed)
Pt hitting self in head with Bible repeatedly. Bible taken from patient and patient redirected and calmed.
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
## 2014-11-12 NOTE — ED Notes (Signed)
Patient laying in bed, incomprehensible speech, pulling covers over head repeatedly.  Attempted to redirect without success.  Pt will be given PRN xanax for anxiety.
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
## 2014-11-12 NOTE — ED Notes (Signed)
Pt is awake and alert, responding to internal stimuli. Pt is yelling and screaming out. Striking the headboard and bed. Medication provided. Will continue to monitor for safety. tlewis

## 2014-11-12 NOTE — ED Notes (Signed)
Pt is alert, speech is at times not comprehendible, pt appears to be responding to internal stimuli, respirations even and unlabored, skin warm and dry, in NAD.

## 2014-11-13 DIAGNOSIS — F209 Schizophrenia, unspecified: Secondary | ICD-10-CM | POA: Diagnosis not present

## 2014-11-13 LAB — URINE CULTURE

## 2014-11-13 MED ORDER — TRAZODONE HCL 50 MG PO TABS
25.0000 mg | ORAL_TABLET | Freq: Every evening | ORAL | Status: DC | PRN
  Administered 2014-11-13 – 2014-11-18 (×4): 25 mg via ORAL
  Filled 2014-11-13 (×5): qty 1

## 2014-11-13 NOTE — ED Notes (Signed)
Pt did take her medications without incident but continues to sit on the side of the bed and talk.  Nurse asked if patient was tired and the patient indicated she was but verbal efforts to get her to lie down were unsuccessful.

## 2014-11-13 NOTE — BH Assessment (Signed)
[facility] Assessment Progress Note  Pt has been referred to [facility].  Decision pending.  Jalene Mullet, [place] Triage Specialist [phone]

## 2014-11-13 NOTE — BH Assessment (Signed)
Reassessment 11/13/2014:  Patient was alert this morning. Writer attempted to ask questions but patient rambled nonsensibly. Patient was difficult to understand as her speech was garbled. She appears to be eating well agreeing that she ate all her breakfast this morning. Writer unable to confirm SI and/or HI. Patient, responding to internal stimuli. Patient irritable and yelling out at times.   Dr. Darleene Cleaver and Reginold Agent, NP continue to recommend inpatient [facility] placement.
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
## 2014-11-13 NOTE — BH Assessment (Signed)
[place] Assessment Progress Note  Pt has been referred to [facility].  Mary at the [facility] authorized pt for referral, authorization #[id number] from 11/13/14 - 11/19/14.  Please note that authorization does not mean that pt has been accepted to the facility.  [facility] at [facility] then took demographic information by telephone.  Clinical information was then faxed, and Roque Cash acknowledged receipt.  This Probation officer has requested that pt be considered for their priority wait list.  Final decision is pending as of this writing.  Jalene Mullet, [place] Triage Specialist [phone]

## 2014-11-13 NOTE — ED Notes (Signed)
Upon inspection of the patient after she had a bowel movement nursing noted that the patient has hemorrhoids.   Pt's hemorrhoids appear slightly bloody.  Pt states that she is experiencing no pain but does have itching.  It is noted that patient doesn't have a charted history of hemorrhoids.
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
## 2014-11-13 NOTE — BH Assessment (Signed)
[place] Assessment Progress Note  At 14:52 Marlowe Kays calls from [facility].  Pt has been placed on their wait list.  She adds that there is not a priority list for their geriatric unit.  Jalene Mullet, [place] Triage Specialist [phone]

## 2014-11-13 NOTE — ED Notes (Signed)
Per Son, please do not d/c patient without consulting with him.  He can be reached at [phone].  Joretta Bachelor

## 2014-11-13 NOTE — ED Notes (Signed)
Pt up to desk.  Has to be reoriented to go back to room.

## 2014-11-14 DIAGNOSIS — F209 Schizophrenia, unspecified: Secondary | ICD-10-CM | POA: Diagnosis not present

## 2014-11-14 LAB — URINALYSIS, ROUTINE W REFLEX MICROSCOPIC
BILIRUBIN URINE: NEGATIVE
GLUCOSE, UA: NEGATIVE mg/dL
HGB URINE DIPSTICK: NEGATIVE
KETONES UR: NEGATIVE mg/dL
Leukocytes, UA: NEGATIVE
Nitrite: NEGATIVE
PH: 5.5 (ref 5.0–8.0)
Protein, ur: NEGATIVE mg/dL
SPECIFIC GRAVITY, URINE: 1.008 (ref 1.005–1.030)
Urobilinogen, UA: 0.2 mg/dL (ref 0.0–1.0)

## 2014-11-14 LAB — BRAIN NATRIURETIC PEPTIDE: B NATRIURETIC PEPTIDE 5: 764.5 pg/mL — AB (ref 0.0–100.0)

## 2014-11-14 MED ORDER — IPRATROPIUM-ALBUTEROL 0.5-2.5 (3) MG/3ML IN SOLN
3.0000 mL | Freq: Two times a day (BID) | RESPIRATORY_TRACT | Status: DC
  Administered 2014-11-14 – 2014-11-19 (×9): 3 mL via RESPIRATORY_TRACT
  Filled 2014-11-14 (×11): qty 3

## 2014-11-14 MED ORDER — POTASSIUM CHLORIDE CRYS ER 20 MEQ PO TBCR
40.0000 meq | EXTENDED_RELEASE_TABLET | Freq: Once | ORAL | Status: AC
Start: 2014-11-14 — End: 2014-11-14
  Administered 2014-11-14: 40 meq via ORAL
  Filled 2014-11-14: qty 2

## 2014-11-14 MED ORDER — FUROSEMIDE 40 MG PO TABS
40.0000 mg | ORAL_TABLET | Freq: Once | ORAL | Status: AC
  Administered 2014-11-14: 40 mg via ORAL
  Filled 2014-11-14: qty 1

## 2014-11-14 MED ORDER — IPRATROPIUM-ALBUTEROL 0.5-2.5 (3) MG/3ML IN SOLN
3.0000 mL | RESPIRATORY_TRACT | Status: DC
  Administered 2014-11-14 (×2): 3 mL via RESPIRATORY_TRACT
  Filled 2014-11-14 (×2): qty 3

## 2014-11-14 NOTE — ED Notes (Signed)
Pt awoke suddenly and began coughing.  Pt became short of breath and very anxious.  Pt's speech was unintelligible but Nursing was able to calm patient down which resolved shortness of breath.  Pt was given cold water which further relieved her symptoms.

## 2014-11-14 NOTE — ED Notes (Signed)
Pt continues to talk.  Sitter Miranda Ballard is engaging with patient which has kept her talking.  Pt seems a tad distressed and after several attempts we seem to believe that patient would like to talk to a preacher, or see her current preacher.

## 2014-11-14 NOTE — ED Notes (Signed)
Pt is continuing to talk and at times begins to cough which is believed to be the fact that her mouth is becoming dry.  Cool liquids seem to aliviates her symptoms.

## 2014-11-14 NOTE — BH Assessment (Signed)
[place] Assessment Progress Note  At 08:06 Gilchrist at [facility] reports that pt remains on wait list.  Jalene Mullet, MA Triage Specialist [phone]

## 2014-11-14 NOTE — ED Notes (Signed)
Pt is reclined in chair, appears to be resting, respirations even and unlabored, skin warm and dry, in NAD, will continue to monitor.

## 2014-11-14 NOTE — ED Notes (Signed)
Pt. Sons at bedside.

## 2014-11-14 NOTE — BH Assessment (Signed)
Reassessment 11/14/2014:  Writer met with patient to complete a re-assessment. Patient was alert this morning. Writer attempted to ask questions but patient was difficult to understand. Patient's speech was garbled. She states she was eating well and ate all her breakfast this morning. Patient also reports sleeping well. Patient nods her head "No" when asked if she wants to harm self or others. Patient denies AVH's. Patient may be responding to internal stimuli as she yells out at times. Patient irritable but her level of irritation seems to be better than it was yesterday.  Dr. Darleene Cleaver and Reginold Agent, NP continue to recommend inpatient Gero Psych placement. Pt has been referred and accepted to [facility]. Mary at the [facility] authorized pt for referral, authorization #[id number] from 11/13/14 - 11/19/14. At 08:06 Roque Cash at [facility] reports that pt remains on wait list.

## 2014-11-14 NOTE — ED Notes (Signed)
Pt's cough has progressively gotten worse so Dr Claudine Mouton was consulted.  A BNP and a breathing treatment have been ordered.

## 2014-11-15 DIAGNOSIS — F209 Schizophrenia, unspecified: Secondary | ICD-10-CM | POA: Diagnosis not present

## 2014-11-15 DIAGNOSIS — R4689 Other symptoms and signs involving appearance and behavior: Secondary | ICD-10-CM | POA: Diagnosis not present

## 2014-11-15 LAB — BASIC METABOLIC PANEL
ANION GAP: 6 (ref 5–15)
BUN: 19 mg/dL (ref 6–20)
CALCIUM: 9.3 mg/dL (ref 8.9–10.3)
CO2: 33 mmol/L — AB (ref 22–32)
CREATININE: 0.77 mg/dL (ref 0.44–1.00)
Chloride: 97 mmol/L — ABNORMAL LOW (ref 101–111)
GFR calc Af Amer: >60 mL/min (ref >60)
GLUCOSE: 71 mg/dL (ref 65–99)
Potassium: 4.4 mmol/L (ref 3.5–5.1)
Sodium: 136 mmol/L (ref 135–145)

## 2014-11-15 LAB — URINE CULTURE: CULTURE: NO GROWTH

## 2014-11-15 MED ORDER — LORAZEPAM 0.5 MG PO TABS
0.5000 mg | ORAL_TABLET | Freq: Two times a day (BID) | ORAL | Status: DC
  Administered 2014-11-15 – 2014-11-20 (×11): 0.5 mg via ORAL
  Filled 2014-11-15 (×11): qty 1

## 2014-11-15 MED ORDER — ALBUTEROL SULFATE (2.5 MG/3ML) 0.083% IN NEBU
5.0000 mg | INHALATION_SOLUTION | Freq: Once | RESPIRATORY_TRACT | Status: AC
  Administered 2014-11-15: 5 mg via RESPIRATORY_TRACT
  Filled 2014-11-15: qty 6

## 2014-11-15 NOTE — ED Notes (Signed)
Awake. Verbally responsive. Resp even and unlabored. ABC's intact. No behavior problems noted. NAD noted. Sitter at bedside.

## 2014-11-15 NOTE — ED Notes (Signed)
Awake. Verbally responsive. Resp even and unlabored. ABC's intact. Pt noted attempting to get out of chair and self ambulate. Gait steady. Pt noted talking to self. Speech hard to understand at times. Pt spoken to son on phone. NAD noted. Sitter at bedside.

## 2014-11-15 NOTE — ED Notes (Signed)
Pt awaken and called and left message with son. Pt upset that son has not visited her today. Pt also reported having itching to groin area. Pt was given house cream and explained the purpose for the cream. Pt verbalized understanding.

## 2014-11-15 NOTE — ED Notes (Signed)
Pt OOB ambulating in hallway and wanting to leave. Pt easily re-directed back to room via EDs tech.

## 2014-11-15 NOTE — BH Assessment (Signed)
[place] Assessment Progress Note  At 09:10 Robinette at [facility] reports that pt remains on their wait list.  Jalene Mullet, [place] Triage Specialist [phone]

## 2014-11-15 NOTE — ED Notes (Signed)
Awake. Verbally responsive. Resp even and unlabored. Noted occ congested cough. ABC's intact. Pleasant and cooperative mood. Pt noted moving around in room. NAD noted. Sitter at bedside.

## 2014-11-15 NOTE — ED Notes (Addendum)
Pt at desk wanting to speak with son. Pt reminded of talking to son earlier and will try to call him later. Pt easily re-directed and taken back to room. Pt sitting in chair.

## 2014-11-15 NOTE — ED Notes (Signed)
Pt standing at sink, repeatedly washing face with wash cloth.
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
## 2014-11-16 DIAGNOSIS — F209 Schizophrenia, unspecified: Secondary | ICD-10-CM | POA: Diagnosis not present

## 2014-11-16 MED ORDER — POTASSIUM CHLORIDE 20 MEQ/15ML (10%) PO SOLN
10.0000 meq | Freq: Every day | ORAL | Status: DC
  Administered 2014-11-16 – 2014-11-20 (×5): 10 meq via ORAL
  Filled 2014-11-16 (×5): qty 15

## 2014-11-16 MED ORDER — FLUCONAZOLE 150 MG PO TABS
150.0000 mg | ORAL_TABLET | Freq: Once | ORAL | Status: AC
  Administered 2014-11-16: 150 mg via ORAL
  Filled 2014-11-16: qty 1

## 2014-11-16 NOTE — BH Assessment (Signed)
[place] Assessment Progress Note  At 09:18 Pam at [facility] confirms that pt remains on wait list.  Jalene Mullet, MA Triage Specialist [phone]

## 2014-11-16 NOTE — Clinical Social Work Note (Addendum)
CSW met with pt for re assessment.  Pt does not make any sense when she speaks and her voice/affect becomes angry when spoken to.  Per pt's RN pt has been standing at the sink and washing herself for the past hour.  She has been given her PRN medications as well as her scheduled medications and she appears to be "slowing down" per her RN.   Pt continues to meet in patient criteria and hospital will continue to look for an in patient bed  .Dede Query, [facility] Triage TTS

## 2014-11-16 NOTE — ED Notes (Signed)
Pt cyclicly sleeps for approx. 10 mins in the recliner and then wakes and rocks anxiously. Pt is unable to rest fully.

## 2014-11-16 NOTE — BH Assessment (Signed)
[place] Assessment Progress Note  This writer has continued to seek placement for this patient at a private geriatric psychiatry unit.  The following facilities have been contacted to seek placement for this pt, with results as noted:  Beds available, information sent, decision pending:  [place]:  Virgel Bouquet [place], [place] Triage Specialist [phone]

## 2014-11-17 DIAGNOSIS — F209 Schizophrenia, unspecified: Secondary | ICD-10-CM | POA: Diagnosis not present

## 2014-11-17 MED ORDER — WITCH HAZEL-GLYCERIN EX PADS
MEDICATED_PAD | CUTANEOUS | Status: DC | PRN
  Filled 2014-11-17 (×2): qty 100

## 2014-11-17 MED ORDER — IPRATROPIUM-ALBUTEROL 0.5-2.5 (3) MG/3ML IN SOLN
3.0000 mL | Freq: Once | RESPIRATORY_TRACT | Status: AC
  Administered 2014-11-17: 3 mL via RESPIRATORY_TRACT
  Filled 2014-11-17: qty 3

## 2014-11-17 NOTE — Progress Notes (Signed)
Disposition CSW completed additional patient referrals to the following Geri-Psych facilities:  [facility]  CSW will continue to assist with patient placement needs.  [place] Disposition CSW [phone]

## 2014-11-17 NOTE — ED Notes (Signed)
No furthur c/o discomfort.

## 2014-11-17 NOTE — ED Notes (Addendum)
All of patient's linens changed-noted to be soiled with urine. Diaper applied for patient as well as barrier cream. Hemorrhoids noted to be bleeding scant amounts of sanguineous drainage. Given crackers, juice and water. Resting quietly at this time.

## 2014-11-17 NOTE — ED Notes (Signed)
Bed: TE85 Expected date:  Expected time:  Means of arrival:  Comments: TCU

## 2014-11-17 NOTE — ED Notes (Signed)
Incontinent of stool/urine. Skin care done.

## 2014-11-17 NOTE — ED Notes (Signed)
Patient has agreed to nebulizer treatment that she initially refused.  Respiratory notified and informed Andree Moro will be here shortly.

## 2014-11-17 NOTE — ED Notes (Signed)
Patient continues to be confused and disoriented. Repetitive behaviors and can be irritable at times.  C/O hemrroid discomfort and ordered prn medication administered. Support and redirection offered.  Assist with ADL.  Sitter continued for safety.

## 2014-11-17 NOTE — ED Notes (Signed)
Sitter at bedside.  Patient is asleep

## 2014-11-17 NOTE — BH Assessment (Signed)
Writer met with pt for reassessment. Pt is oriented x 4 and she knows the [place] President is Obama. Pt shakes her head "no" when writer asks if pt is thinking of harming herself or anyone else. Pt's speech is garbled. She answers "yes" when asked if she slept well and if she has a good appetite today. Per Charmaine Downs NP and Dr Parke Poisson, TTS is still seeking inpatient placement for pt. Pt's MHT reports pt was able to remember her son's phone number and was able to dial the number. MHT reports pt became upset when therapist was getting pt to use nebulizer.  Arnold Long, [place] Therapeutic Triage Specialist

## 2014-11-18 DIAGNOSIS — F209 Schizophrenia, unspecified: Secondary | ICD-10-CM | POA: Diagnosis not present

## 2014-11-18 NOTE — ED Notes (Signed)
Pt allowed to make a phone call to son.  He did not answer so she left a message and became upset when done.  Pt then escorted to bathroom.

## 2014-11-18 NOTE — ED Notes (Signed)
Pt given saltines and cranberry juice.

## 2014-11-18 NOTE — ED Notes (Signed)
Pt called son and became upset that he was not already here again.  Pt is redirectable and non combative.

## 2014-11-18 NOTE — ED Notes (Signed)
Pt tolerated all night medications well. She took with applesauce and water.  During medication administration, she kept swatting at something on the bed as if she was trying to get it off. She did not respond to what she was swatting at.

## 2014-11-18 NOTE — ED Notes (Signed)
Patient helped by NA to restroom and has another BM

## 2014-11-18 NOTE — ED Notes (Signed)
ADL performed.  Gave patient drink and crackers

## 2014-11-18 NOTE — ED Notes (Addendum)
Patient removed her diaper and continues to pull at her clothes.  She was talking about her son and requested her Bible.    Sitter at bedside.  Patient refused to allow [place] to take a new set of vital signs

## 2014-11-18 NOTE — ED Notes (Signed)
Report given to Rachel, RN.

## 2014-11-18 NOTE — ED Notes (Signed)
Bed: WA29 Expected date:  Expected time:  Means of arrival:  Comments: For 25

## 2014-11-18 NOTE — ED Notes (Signed)
Pt ate well at lunch.  Pt is sitting on bed and in her chair (back and forth) laughing to herself.
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
## 2014-11-18 NOTE — ED Notes (Signed)
Patient showered by staff. Patient had a BM and urinated while showering.

## 2014-11-18 NOTE — ED Notes (Signed)
Pt given saltine crackers.

## 2014-11-18 NOTE — ED Notes (Signed)
Pt sitting in chair talking earnestly with the air in front of her.  Pt is redirectable and cooperative at this time.

## 2014-11-19 DIAGNOSIS — F0391 Unspecified dementia with behavioral disturbance: Secondary | ICD-10-CM | POA: Diagnosis not present

## 2014-11-19 DIAGNOSIS — R4689 Other symptoms and signs involving appearance and behavior: Secondary | ICD-10-CM | POA: Diagnosis not present

## 2014-11-19 DIAGNOSIS — F209 Schizophrenia, unspecified: Secondary | ICD-10-CM | POA: Diagnosis not present

## 2014-11-19 MED ORDER — RISPERIDONE 0.5 MG PO TBDP: 0.5000 mg | ORAL_TABLET | Freq: Two times a day (BID) | ORAL | Status: AC

## 2014-11-19 MED ORDER — TRAZODONE HCL 50 MG PO TABS
25.0000 mg | ORAL_TABLET | Freq: Every evening | ORAL | Status: DC | PRN
  Administered 2014-11-19: 25 mg via ORAL
  Filled 2014-11-19: qty 1

## 2014-11-19 MED ORDER — TRAZODONE HCL 50 MG PO TABS: 25.0000 mg | ORAL_TABLET | Freq: Every evening | ORAL | Status: AC | PRN

## 2014-11-19 MED ORDER — LORAZEPAM 0.5 MG PO TABS: 0.5000 mg | ORAL_TABLET | Freq: Two times a day (BID) | ORAL | Status: DC

## 2014-11-19 MED ORDER — POTASSIUM CHLORIDE 20 MEQ/15ML (10%) PO SOLN: 10.0000 meq | Freq: Every day | ORAL | Status: AC

## 2014-11-19 MED ORDER — IPRATROPIUM-ALBUTEROL 0.5-2.5 (3) MG/3ML IN SOLN
3.0000 mL | RESPIRATORY_TRACT | Status: DC | PRN
  Administered 2014-11-19 – 2014-11-20 (×2): 3 mL via RESPIRATORY_TRACT
  Filled 2014-11-19 (×2): qty 3

## 2014-11-19 NOTE — Discharge Instructions (Signed)
Schizophrenia Schizophrenia is a mental illness. It may cause disturbed or disorganized thinking, speech, or behavior. People with schizophrenia have problems functioning in one or more areas of life: work, school, home, or relationships. People with schizophrenia are at increased risk for suicide, certain chronic physical illnesses, and unhealthy behaviors, such as smoking and drug use. People who have family members with schizophrenia are at higher risk of developing the illness. Schizophrenia affects men and women equally but usually appears at an earlier age (teenage or early adult years) in men.  SYMPTOMS The earliest symptoms are often subtle (prodrome) and may go unnoticed until the illness becomes more severe (first-break psychosis). Symptoms of schizophrenia may be continuous or may come and go in severity. Episodes often are triggered by major life events, such as family stress, college, [employer], marriage, pregnancy or child birth, divorce, or loss of a loved one. People with schizophrenia may see, hear, or feel things that do not exist (hallucinations). They may have false beliefs in spite of obvious proof to the contrary (delusions). Sometimes speech is incoherent or behavior is odd or withdrawn.  DIAGNOSIS Schizophrenia is diagnosed through an assessment by your caregiver. Your caregiver will ask questions about your thoughts, behavior, mood, and ability to function in daily life. Your caregiver may ask questions about your medical history and use of alcohol or drugs, including prescription medication. Your caregiver may also order blood tests and imaging exams. Certain medical conditions and substances can cause symptoms that resemble schizophrenia. Your caregiver may refer you to a mental health specialist for evaluation. There are three major criterion for a diagnosis of schizophrenia:  Two or more of the following five symptoms are present for a month or longer:  Delusions. Often  the delusions are that you are being attacked, harassed, cheated, persecuted or conspired against (persecutory delusions).  Hallucinations.   Disorganized speech that does not make sense to others.  Grossly disorganized (confused or unfocused) behavior or extremely overactive or underactive motor activity (catatonia).  Negative symptoms such as bland or blunted emotions (flat affect), loss of will power (avolition), and withdrawal from social contacts (social isolation).  Level of functioning in one or more major areas of life (work, school, relationships, or self-care) is markedly below the level of functioning before the onset of illness.   There are continuous signs of illness (either mild symptoms or decreased level of functioning) for at least 6 months or longer. TREATMENT  Schizophrenia is a long-term illness. It is best controlled with continuous treatment rather than treatment only when symptoms occur. The following treatments are used to manage schizophrenia:  Medication--Medication is the most effective and important form of treatment for schizophrenia. Antipsychotic medications are usually prescribed to help manage schizophrenia. Other types of medication may be added to relieve any symptoms that may occur despite the use of antipsychotic medications.  Counseling or talk therapy--Individual, group, or family counseling may be helpful in providing education, support, and guidance. Many people with schizophrenia also benefit from social skills and job skills (vocational) training. A combination of medication and counseling is best for managing the disorder over time. A procedure in which electricity is applied to the brain through the scalp (electroconvulsive therapy) may be used to treat catatonic schizophrenia or schizophrenia in people who cannot take or do not respond to medication and counseling.   This information is not intended to replace advice given to you by your health  care provider. Make sure you discuss any questions you have with  your health care provider.   Document Released: 01/17/2000 Document Revised: 02/09/2014 Document Reviewed: 04/13/2012 Elsevier Interactive Patient Education [employer].

## 2014-11-19 NOTE — ED Notes (Addendum)
Pt sitting in her recliner wanting to dye her hair. She has cranberry juice and rubs  it in her hair.
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
## 2014-11-19 NOTE — BHH Suicide Risk Assessment (Signed)
Suicide Risk Assessment  Discharge Assessment   [facility] Discharge Suicide Risk Assessment   Demographic Factors:  Age 73 or older  Total Time spent with patient: 30 minutes  Musculoskeletal: Strength & Muscle Tone: within normal limits Gait & Station: normal Patient leans: N/A  Psychiatric Specialty Exam: Review of Systems  Constitutional: Negative.  HENT: Negative.  Eyes: Negative.  Respiratory: Negative.  Cardiovascular: Negative.  Gastrointestinal: Negative.  Genitourinary: Negative.  Musculoskeletal: Negative.  Skin: Negative.  Neurological: Negative.  Endo/Heme/Allergies: Negative.  Psychiatric/Behavioral: Positive for memory loss.    Blood pressure 121/70, pulse 76, temperature 97.8 F (36.6 C), temperature source Oral, resp. rate 16, SpO2 99 %.There is no weight on file to calculate BMI.  General Appearance: Casual  Eye Contact:: Good  Speech: garbled due to teeth  Volume: Normal  Mood: Euthymic  Affect: Congruent  Thought Process: Coherent  Orientation: Full (Time, Place, and Person)  Thought Content: WDL  Suicidal Thoughts: No  Homicidal Thoughts: No  Memory: Immediate; Fair Recent; Poor Remote; Fair  Judgement: Fair  Insight: Fair  Psychomotor Activity: Normal  Concentration: Fair  Recall: [employer] of Knowledge:Fair  Language: Fair  Akathisia: No  Handed: Right  AIMS (if indicated):    Assets: Housing Leisure Time Resilience Social Support  ADL's: Intact  Cognition: Impaired, Moderate  Sleep:          Has this patient used any form of tobacco in the last 30 days? (Cigarettes, Smokeless Tobacco, Cigars, and/or Pipes) No  Mental Status Per Nursing Assessment::   On Admission:   Agitation, aggression  Current Mental Status by Physician: NA  Loss Factors: NA  Historical Factors: Impulsivity  Risk Reduction Factors:   Sense of responsibility to family, Religious  beliefs about death, Living with another person, especially a relative, Positive social support and Positive therapeutic relationship  Continued Clinical Symptoms:  None  Cognitive Features That Contribute To Risk:  None    Suicide Risk:  Minimal: No identifiable suicidal ideation.  Patients presenting with no risk factors but with morbid ruminations; may be classified as minimal risk based on the severity of the depressive symptoms  Principal Problem: Dementia with behavioral disturbance Discharge Diagnoses:  Patient Active Problem List   Diagnosis Date Noted  . Dementia with behavioral disturbance [F03.91] 04/18/2014    Priority: High  . Schizophrenia ([place]) [F20.9]     Priority: High  . Combative behavior [R46.89]   . Essential hypertension [I10] 08/09/2013  . Pulmonary HTN ([place]) [I27.2] 08/09/2013  . Sinus bradycardia [R00.1] 08/09/2013  . HCAP (healthcare-associated pneumonia) [J18.9] 01/24/2012  . Shortness of breath [R06.02] 01/23/2012  . Leukocytosis [D72.829] 01/23/2012  . Hyponatremia [E87.1] 01/23/2012  . COPD (chronic obstructive pulmonary disease) ([place]) [J44.9] 07/08/2011  . Other peripheral vascular disease(443.89) [I73.89] 07/08/2011  . Osteoporosis [733.0]   . GERD (gastroesophageal reflux disease) [K21.9]   . Peripheral vascular disease ([place]) [I73.9]   . Glaucoma [365]       Plan Of Care/Follow-up recommendations:  Activity:  as tolerated Diet:  heart healthy diet  Is patient on multiple antipsychotic therapies at discharge:  No   Has Patient had three or more failed trials of antipsychotic monotherapy by history:  No  Recommended Plan for Multiple Antipsychotic Therapies: NA    LORD, JAMISON, PMH-NP 11/19/2014, 2:16 PM

## 2014-11-19 NOTE — ED Notes (Addendum)
Patient is resting comfortably, sitter at bedside

## 2014-11-19 NOTE — ED Notes (Signed)
Patient is resting comfortably in recliner.

## 2014-11-19 NOTE — BH Assessment (Signed)
[place] Assessment Progress Note  At 09:20 [name] at [facility] reports that this pt remains on their wait list.  Jalene Mullet, [place] Triage Specialist [phone]
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
## 2014-11-20 DIAGNOSIS — F209 Schizophrenia, unspecified: Secondary | ICD-10-CM | POA: Diagnosis not present

## 2014-11-20 MED ORDER — LORAZEPAM 0.5 MG PO TABS: 0.5000 mg | ORAL_TABLET | Freq: Two times a day (BID) | ORAL | Status: AC

## 2014-11-20 NOTE — Progress Notes (Signed)
CSW faxed signed FL2 to Tammy who is the memory care Freight forwarder at [facility]. Tammy states that she will review FL2. CSW will continue to follow up.  Willette Brace [phone] ED CSW 11/20/2014 2:34 PM

## 2014-11-20 NOTE — Progress Notes (Signed)
CSW reached out to [facility], who states that the patient is welcomed back.   CSW made NP aware. CSW made nurse aware.  Willette Brace [phone] ED CSW 11/20/2014 2:53 PM

## 2014-11-20 NOTE — ED Notes (Signed)
Pt had large soft bm.  Pt placed in shower.  Noted prolapsed rectum on assessment.

## 2014-11-20 NOTE — ED Notes (Signed)
Pt woke up tearful. Able to be redirected and calm down. She is sleeping in recliner, refuses to lye down in bed.

## 2014-11-20 NOTE — ED Notes (Signed)
Left voice mail with son regarding transport back to [facility].

## 2014-11-20 NOTE — Progress Notes (Signed)
CSW spoke with Tammy of [facility] who states that staff from facility came to evaluate patient last night. Tammy states that she will need a updated FL2 for patient. She states that patient should be able to come back to facility.   CSW will review FL2 and send to facility. CSW made NP aware. CSW made nurse aware.  Willette Brace [phone] ED CSW 11/20/2014 12:00 PM

## 2014-11-20 NOTE — BH Assessment (Signed)
Reassessment 11/20/2014:  Writer met with patient to complete a re-assessment. Patient was alert this morning. Writer attempted to ask questions but patient was difficult to understand. Patient's speech was garbled. She states she was eating well and ate all her breakfast specifically her eggs this morning. Patient also reports sleeping well. Patient nods her head "No" when asked if she wants to harm self or others. Patient denies AVH's. Patient calm and cooperative.  Dr. Darleene Cleaver and Reginold Agent, NP have cleared patient psychiatrically. Patient referred to LCSW for discharge planning and coordinating placement back at her ALF.

## 2014-11-25 ENCOUNTER — Emergency Department (HOSPITAL_COMMUNITY)
Admission: EM | Admit: 2014-11-25 | Discharge: 2014-12-01 | Disposition: A | Discharge status: Other Acute Inpatient Hospital | Payer: Medicare Other | Attending: Emergency Medicine | Admitting: Emergency Medicine

## 2014-11-25 ENCOUNTER — Encounter (HOSPITAL_COMMUNITY): Payer: Self-pay | Admitting: Emergency Medicine

## 2014-11-25 DIAGNOSIS — M1990 Unspecified osteoarthritis, unspecified site: Secondary | ICD-10-CM | POA: Insufficient documentation

## 2014-11-25 DIAGNOSIS — Z79899 Other long term (current) drug therapy: Secondary | ICD-10-CM | POA: Diagnosis not present

## 2014-11-25 DIAGNOSIS — J449 Chronic obstructive pulmonary disease, unspecified: Secondary | ICD-10-CM | POA: Insufficient documentation

## 2014-11-25 DIAGNOSIS — G20 Parkinson's disease: Secondary | ICD-10-CM | POA: Diagnosis not present

## 2014-11-25 DIAGNOSIS — K219 Gastro-esophageal reflux disease without esophagitis: Secondary | ICD-10-CM | POA: Diagnosis not present

## 2014-11-25 DIAGNOSIS — Z7951 Long term (current) use of inhaled steroids: Secondary | ICD-10-CM | POA: Insufficient documentation

## 2014-11-25 DIAGNOSIS — D696 Thrombocytopenia, unspecified: Secondary | ICD-10-CM | POA: Diagnosis not present

## 2014-11-25 DIAGNOSIS — M810 Age-related osteoporosis without current pathological fracture: Secondary | ICD-10-CM | POA: Diagnosis not present

## 2014-11-25 DIAGNOSIS — R45850 Homicidal ideations: Secondary | ICD-10-CM | POA: Diagnosis not present

## 2014-11-25 DIAGNOSIS — R4182 Altered mental status, unspecified: Secondary | ICD-10-CM | POA: Diagnosis present

## 2014-11-25 DIAGNOSIS — R454 Irritability and anger: Secondary | ICD-10-CM | POA: Diagnosis not present

## 2014-11-25 DIAGNOSIS — Z720 Tobacco use: Secondary | ICD-10-CM | POA: Insufficient documentation

## 2014-11-25 DIAGNOSIS — R451 Restlessness and agitation: Secondary | ICD-10-CM | POA: Insufficient documentation

## 2014-11-25 DIAGNOSIS — F0391 Unspecified dementia with behavioral disturbance: Secondary | ICD-10-CM

## 2014-11-25 DIAGNOSIS — I509 Heart failure, unspecified: Secondary | ICD-10-CM | POA: Insufficient documentation

## 2014-11-25 DIAGNOSIS — F919 Conduct disorder, unspecified: Secondary | ICD-10-CM | POA: Insufficient documentation

## 2014-11-25 DIAGNOSIS — R4689 Other symptoms and signs involving appearance and behavior: Secondary | ICD-10-CM | POA: Diagnosis not present

## 2014-11-25 DIAGNOSIS — I10 Essential (primary) hypertension: Secondary | ICD-10-CM | POA: Diagnosis not present

## 2014-11-25 DIAGNOSIS — F329 Major depressive disorder, single episode, unspecified: Secondary | ICD-10-CM | POA: Diagnosis not present

## 2014-11-25 DIAGNOSIS — F209 Schizophrenia, unspecified: Secondary | ICD-10-CM | POA: Diagnosis not present

## 2014-11-25 DIAGNOSIS — F03918 Unspecified dementia, unspecified severity, with other behavioral disturbance: Secondary | ICD-10-CM | POA: Diagnosis present

## 2014-11-25 DIAGNOSIS — Z049 Encounter for examination and observation for unspecified reason: Secondary | ICD-10-CM

## 2014-11-25 HISTORY — DX: Unspecified dementia, unspecified severity, without behavioral disturbance, psychotic disturbance, mood disturbance, and anxiety: F03.90

## 2014-11-25 LAB — BASIC METABOLIC PANEL
ANION GAP: 6 (ref 5–15)
BUN: 16 mg/dL (ref 6–20)
CO2: 28 mmol/L (ref 22–32)
Calcium: 9.1 mg/dL (ref 8.9–10.3)
Chloride: 103 mmol/L (ref 101–111)
Creatinine, Ser: 0.76 mg/dL (ref 0.44–1.00)
GFR calc Af Amer: >60 mL/min (ref >60)
GFR calc non Af Amer: >60 mL/min (ref >60)
GLUCOSE: 65 mg/dL (ref 65–99)
POTASSIUM: 4.4 mmol/L (ref 3.5–5.1)
Sodium: 137 mmol/L (ref 135–145)

## 2014-11-25 LAB — CBC WITH DIFFERENTIAL/PLATELET
BASOS ABS: 0 10*3/uL (ref 0.0–0.1)
Basophils Relative: 1 %
EOS PCT: 1 %
Eosinophils Absolute: 0.1 10*3/uL (ref 0.0–0.7)
HEMATOCRIT: 31.9 % — AB (ref 36.0–46.0)
Hemoglobin: 10.4 g/dL — ABNORMAL LOW (ref 12.0–15.0)
LYMPHS ABS: 1.6 10*3/uL (ref 0.7–4.0)
LYMPHS PCT: 38 %
MCH: 31.5 pg (ref 26.0–34.0)
MCHC: 32.6 g/dL (ref 30.0–36.0)
MCV: 96.7 fL (ref 78.0–100.0)
MONO ABS: 0.9 10*3/uL (ref 0.1–1.0)
Monocytes Relative: 22 %
NEUTROS ABS: 1.7 10*3/uL (ref 1.7–7.7)
Neutrophils Relative %: 38 %
PLATELETS: 297 10*3/uL (ref 150–400)
RBC: 3.3 MIL/uL — AB (ref 3.87–5.11)
RDW: 14.8 % (ref 11.5–15.5)
WBC: 4.3 10*3/uL (ref 4.0–10.5)

## 2014-11-25 LAB — ETHANOL: Alcohol, Ethyl (B): <5 mg/dL (ref <5)

## 2014-11-25 LAB — CARBAMAZEPINE LEVEL, TOTAL: Carbamazepine Lvl: 4.4 ug/mL (ref 4.0–12.0)

## 2014-11-25 MED ORDER — LORAZEPAM 2 MG/ML IJ SOLN
INTRAMUSCULAR | Status: AC
  Administered 2014-11-25: 1 mg
  Filled 2014-11-25: qty 1

## 2014-11-25 MED ORDER — TRAZODONE HCL 50 MG PO TABS
25.0000 mg | ORAL_TABLET | Freq: Every evening | ORAL | Status: DC | PRN
Start: 2014-11-25 — End: 2014-12-01
  Administered 2014-11-25 – 2014-11-30 (×3): 25 mg via ORAL
  Filled 2014-11-25 (×4): qty 1

## 2014-11-25 MED ORDER — RISPERIDONE 0.5 MG PO TBDP
0.5000 mg | ORAL_TABLET | Freq: Two times a day (BID) | ORAL | Status: DC
  Administered 2014-11-25 – 2014-12-01 (×12): 0.5 mg via ORAL
  Filled 2014-11-25 (×14): qty 1

## 2014-11-25 MED ORDER — LISINOPRIL 5 MG PO TABS
5.0000 mg | ORAL_TABLET | Freq: Every day | ORAL | Status: DC
  Administered 2014-11-26 – 2014-11-30 (×5): 5 mg via ORAL
  Filled 2014-11-25 (×7): qty 1

## 2014-11-25 MED ORDER — THEOPHYLLINE ER 100 MG PO CP24
100.0000 mg | ORAL_CAPSULE | Freq: Two times a day (BID) | ORAL | Status: DC
  Filled 2014-11-25 (×2): qty 1

## 2014-11-25 MED ORDER — LORAZEPAM 0.5 MG PO TABS
0.5000 mg | ORAL_TABLET | Freq: Two times a day (BID) | ORAL | Status: DC
  Administered 2014-11-25 – 2014-12-01 (×11): 0.5 mg via ORAL
  Filled 2014-11-25 (×12): qty 1

## 2014-11-25 MED ORDER — IPRATROPIUM-ALBUTEROL 0.5-2.5 (3) MG/3ML IN SOLN
3.0000 mL | Freq: Once | RESPIRATORY_TRACT | Status: AC
  Administered 2014-11-25: 3 mL via RESPIRATORY_TRACT
  Filled 2014-11-25: qty 3

## 2014-11-25 MED ORDER — DONEPEZIL HCL 5 MG PO TABS
5.0000 mg | ORAL_TABLET | Freq: Every day | ORAL | Status: DC
  Administered 2014-11-25 – 2014-11-30 (×5): 5 mg via ORAL
  Filled 2014-11-25 (×7): qty 1

## 2014-11-25 MED ORDER — MOMETASONE FURO-FORMOTEROL FUM 100-5 MCG/ACT IN AERO
2.0000 | INHALATION_SPRAY | Freq: Two times a day (BID) | RESPIRATORY_TRACT | Status: DC
  Administered 2014-11-25 – 2014-12-01 (×11): 2 via RESPIRATORY_TRACT
  Filled 2014-11-25 (×2): qty 8.8

## 2014-11-25 MED ORDER — CARBAMAZEPINE 200 MG PO TABS
200.0000 mg | ORAL_TABLET | Freq: Two times a day (BID) | ORAL | Status: DC
  Administered 2014-11-25 (×2): 200 mg via ORAL
  Filled 2014-11-25 (×4): qty 1

## 2014-11-25 MED ORDER — THEOPHYLLINE ER 100 MG PO TB12
100.0000 mg | ORAL_TABLET | Freq: Two times a day (BID) | ORAL | Status: DC
  Administered 2014-11-25 – 2014-12-01 (×12): 100 mg via ORAL
  Filled 2014-11-25 (×15): qty 1

## 2014-11-25 MED ORDER — LORAZEPAM 1 MG PO TABS: 1.0000 mg | ORAL_TABLET | Freq: Three times a day (TID) | ORAL | Status: DC | PRN

## 2014-11-25 MED ORDER — FERROUS SULFATE 325 (65 FE) MG PO TABS
325.0000 mg | ORAL_TABLET | Freq: Two times a day (BID) | ORAL | Status: DC
  Administered 2014-11-25 – 2014-12-01 (×13): 325 mg via ORAL
  Filled 2014-11-25 (×15): qty 1

## 2014-11-25 MED ORDER — MONTELUKAST SODIUM 10 MG PO TABS
10.0000 mg | ORAL_TABLET | Freq: Every day | ORAL | Status: DC
  Administered 2014-11-25 – 2014-12-01 (×7): 10 mg via ORAL
  Filled 2014-11-25 (×8): qty 1

## 2014-11-25 MED ORDER — POTASSIUM CHLORIDE 20 MEQ/15ML (10%) PO SOLN
10.0000 meq | Freq: Every day | ORAL | Status: DC
  Administered 2014-11-25 – 2014-12-01 (×7): 10 meq via ORAL
  Filled 2014-11-25 (×7): qty 15

## 2014-11-25 NOTE — ED Notes (Signed)
Lab delay - pt uncooperative at moment.  RN will touch base with me in 30 min.

## 2014-11-25 NOTE — ED Notes (Signed)
Sitter at bedside.

## 2014-11-25 NOTE — ED Notes (Signed)
Pt seems to be coughing with upper respiratory congestion noted.  Notified MD and resp.  Treatment given for some wheezing noted.
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
## 2014-11-25 NOTE — ED Notes (Signed)
Pt son at the bedside trying to calm pt down
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
## 2014-11-25 NOTE — ED Notes (Signed)
Pt unable to capture urine.  Each sample has had stool.  Pt too combative for in/out cath.
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
## 2014-11-25 NOTE — ED Notes (Addendum)
Dr Maryan Rued at bedside assessing pt

## 2014-11-25 NOTE — ED Notes (Signed)
Son: [phone] Carmelia Roller contact information

## 2014-11-25 NOTE — BH Assessment (Signed)
Consulted with Waylan Boga, DNP who recommends geropsych at this time.   Rosalin Hawking, LCSW Therapeutic Triage Specialist [place] 11/25/2014 1:48 PM
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
## 2014-11-26 DIAGNOSIS — R45850 Homicidal ideations: Secondary | ICD-10-CM | POA: Diagnosis not present

## 2014-11-26 DIAGNOSIS — F919 Conduct disorder, unspecified: Secondary | ICD-10-CM | POA: Diagnosis not present

## 2014-11-26 DIAGNOSIS — F0390 Unspecified dementia without behavioral disturbance: Secondary | ICD-10-CM | POA: Insufficient documentation

## 2014-11-26 DIAGNOSIS — R4689 Other symptoms and signs involving appearance and behavior: Secondary | ICD-10-CM | POA: Diagnosis not present

## 2014-11-26 DIAGNOSIS — F0391 Unspecified dementia with behavioral disturbance: Secondary | ICD-10-CM | POA: Diagnosis not present

## 2014-11-26 MED ORDER — BENZONATATE 100 MG PO CAPS
ORAL_CAPSULE | ORAL | Status: AC
  Filled 2014-11-26: qty 1

## 2014-11-26 MED ORDER — CARBAMAZEPINE ER 200 MG PO TB12
200.0000 mg | ORAL_TABLET | Freq: Two times a day (BID) | ORAL | Status: DC
  Administered 2014-11-26 – 2014-12-01 (×10): 200 mg via ORAL
  Filled 2014-11-26 (×12): qty 1

## 2014-11-26 MED ORDER — BENZONATATE 100 MG PO CAPS
100.0000 mg | ORAL_CAPSULE | Freq: Two times a day (BID) | ORAL | Status: DC | PRN
  Administered 2014-11-26 – 2014-11-30 (×3): 100 mg via ORAL
  Filled 2014-11-26 (×3): qty 1

## 2014-11-26 NOTE — ED Notes (Signed)
Bedside report received from previous RN, Rip Harbour. Pt is roaming around the room and was wet from recent incontinence. Pt cleaned and brief changed by NT. Pt sitting in chair talking to herself.

## 2014-11-26 NOTE — B[facility] Counselor (Signed)
11/26/14 Referral sent to [place], [name], Mikel Cella, [place], [place], [place], [place], [place], [place], [place], and [place] K. Nash Shearer, LPC-A, [facility]  Counselor 11/26/2014 2:05 AM

## 2014-11-26 NOTE — Progress Notes (Signed)
Pt has been awake hysterically laughing alternating with hysterical crying since 0430.  She is interacting to internal stimuli frequently.  Pt has productive cough.  She is very difficult to understand due to garbled speech.  She has drank a lot of fluid this shift and has been incontinent of urine several times. UA not obtained due to incontinence. Fara Olden P

## 2014-11-26 NOTE — BH Assessment (Signed)
[place] Assessment Progress Note  The following facilities have been contacted to seek placement for this pt, with results as noted:  Beds available, information sent, decision pending:  [place]:  [facility], [place] Triage Specialist [phone]
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
## 2014-11-26 NOTE — ED Notes (Signed)
Pt states she is going home now & repeating the phrase, "Bitch, you are going to hell" over and over; refuses to answer questions.

## 2014-11-26 NOTE — ED Notes (Signed)
Pt sitting in recliner with towel wrapped around face, respirations even and unlabored, skin warm and dry, in NAD, tolerating PO fluids, denies further needs, will continue to monitor.

## 2014-11-27 DIAGNOSIS — F919 Conduct disorder, unspecified: Secondary | ICD-10-CM | POA: Diagnosis not present

## 2014-11-27 NOTE — ED Notes (Signed)
Pt had an episode of urinary incontinence, scrubs changed, bed linen changed, clean brief placed on patient. Pt currently sitting in recliner with washcloth over chin, respirations even and unlabored, skin warm and dry, in NAD, will continue to monitor.

## 2014-11-27 NOTE — ED Notes (Signed)
Pt sitting in recliner with Bible in lap, pointing in the air repeatedly and rubbing face repeatedly. Non skin socks placed on patient by this Probation officer, patient allowed this writer to obtain VS, patient ate 100% of lunch tray. Denies further needs, sitter within view of patient, will continue to monitor.

## 2014-11-27 NOTE — BH Assessment (Signed)
[place] Assessment Progress Note  The following facilities have been contacted to seek placement for this pt, with results as noted:  Confirmed that facility has received referral from yesterday (11/26/2014) and has retained it for future consideration:  [facility]   Declined:  [facility]   At capacity:  [place], [place] Triage Specialist [phone]

## 2014-11-27 NOTE — ED Notes (Signed)
Pt has woken up from a nap.  She is becoming very aggitated, hitting when approached.  Her speech is incomprehensible.  Refusing to take medications, wrapping blanket over head and around neck.  Yelling out "you white bitch"  Will continue to monitor.

## 2014-11-27 NOTE — ED Notes (Signed)
Shower is complete.  Pt repeatedly dug into her rectum with her finger to dig out stool.  When given a wash cloth to clean up, she dig into her rectum again.  No more signs of stool.  Pt informed she'd hurt herself if she kept doing that.  Pt dressed into a new brief and scrubs and taken back to her room.

## 2014-11-27 NOTE — ED Notes (Signed)
PRN dose of Trazodone was pulled from pyxis to assist patient with anxiety and sleep.  Patient refused.  Patient laid down in bed and is attempting to sleep presently. Pt calm, cooperative.

## 2014-11-27 NOTE — ED Notes (Signed)
Pt's son has been wanded by security and is now at the bedside visiting patient.

## 2014-11-27 NOTE — ED Notes (Signed)
Pt is currently at the sink, and has been for about 45 minutes, washing her face and picking through her hair, washing hands, etc.

## 2014-11-27 NOTE — ED Notes (Signed)
Patient sleeping in bed, respirations even and non-labored, skin warm and dry.

## 2014-11-27 NOTE — ED Notes (Signed)
Patient awake, alert, talking loudly while sitting up in bed.  Encouraged patient to calm and try to rest.  Pt cooperative.
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
## 2014-11-27 NOTE — ED Notes (Signed)
Pt had tech read to her from her bible about 10 minutes ago.  Currently she is rubbing herself with her bible; her chest, her vagina, her face, her head.

## 2014-11-27 NOTE — ED Notes (Signed)
Pt up out of bed.  Has pulled brief and scrub pants off and is having a BM on the floor.  Continues to yell incomprehensible words.  Offered a shower and pt is agreeable to that.

## 2014-11-27 NOTE — ED Notes (Signed)
Pt given three packs of graham crackers, two packs of peanut butter and cranberry juice. Patient asking for more graham crackers, explained to patient she will not be given any more graham crackers. Patient continually saying "Bitch".

## 2014-11-27 NOTE — ED Notes (Signed)
Pt has taken off adult brief and pants.  Asking for new brief which has been provided.  Pt is now wandering in her room, fixing her linens, and talking, using the sink but is redirectable when she comes into the hallway.  Waiting for lunch tray which Service Response states is leaving the kitchen now.

## 2014-11-28 ENCOUNTER — Encounter (HOSPITAL_COMMUNITY): Payer: Self-pay | Admitting: *Deleted

## 2014-11-28 DIAGNOSIS — F919 Conduct disorder, unspecified: Secondary | ICD-10-CM | POA: Diagnosis not present

## 2014-11-28 LAB — RAPID URINE DRUG SCREEN, HOSP PERFORMED
Amphetamines: NOT DETECTED
BARBITURATES: NOT DETECTED
Benzodiazepines: NOT DETECTED
COCAINE: NOT DETECTED
Opiates: NOT DETECTED
TETRAHYDROCANNABINOL: NOT DETECTED

## 2014-11-28 LAB — URINALYSIS, ROUTINE W REFLEX MICROSCOPIC
BILIRUBIN URINE: NEGATIVE
Glucose, UA: NEGATIVE mg/dL
Ketones, ur: NEGATIVE mg/dL
Leukocytes, UA: NEGATIVE
NITRITE: NEGATIVE
PH: 5.5 (ref 5.0–8.0)
Protein, ur: NEGATIVE mg/dL
SPECIFIC GRAVITY, URINE: 1.01 (ref 1.005–1.030)
UROBILINOGEN UA: 0.2 mg/dL (ref 0.0–1.0)

## 2014-11-28 LAB — URINE MICROSCOPIC-ADD ON

## 2014-11-28 NOTE — ED Notes (Signed)
Pt refused to have her vitals taking at this time

## 2014-11-28 NOTE — ED Notes (Signed)
Pt ate a sandwich

## 2014-11-28 NOTE — ED Notes (Signed)
Pt son visiting at this time.pt doing fine at this time.

## 2014-11-28 NOTE — BH Assessment (Signed)
11/28/2014 Reassessment:  Pt from [facility] Unit via EMS after displaying significant change in behavior. Patient presented to the [facility] on 11/25/2014. Today pt appears agitated because she wants juice. She unable to have juice at this time as she puts it in her hair and on her body.  Patient yells at this writer but she is difficult to understand as her speech is garbled.  Per Dr. Darleene Cleaver, patient to remain in the ED for [place] placement.

## 2014-11-28 NOTE — ED Notes (Signed)
Pt ate a sandwich again

## 2014-11-29 ENCOUNTER — Emergency Department (HOSPITAL_COMMUNITY): Payer: Medicare Other

## 2014-11-29 DIAGNOSIS — F919 Conduct disorder, unspecified: Secondary | ICD-10-CM | POA: Diagnosis not present

## 2014-11-29 LAB — BRAIN NATRIURETIC PEPTIDE: B Natriuretic Peptide: 579.6 pg/mL — ABNORMAL HIGH (ref 0.0–100.0)

## 2014-11-29 MED ORDER — LORAZEPAM 1 MG PO TABS
1.0000 mg | ORAL_TABLET | Freq: Three times a day (TID) | ORAL | Status: DC | PRN
  Administered 2014-12-01: 1 mg via ORAL
  Filled 2014-11-29: qty 1

## 2014-11-29 MED ORDER — LORAZEPAM 2 MG/ML IJ SOLN
INTRAMUSCULAR | Status: AC
  Administered 2014-11-29: 2 mg via INTRAMUSCULAR
  Filled 2014-11-29: qty 1

## 2014-11-29 MED ORDER — LORAZEPAM 2 MG/ML IJ SOLN
2.0000 mg | Freq: Once | INTRAMUSCULAR | Status: AC
  Administered 2014-11-29: 2 mg via INTRAMUSCULAR

## 2014-11-29 MED ORDER — HALOPERIDOL LACTATE 5 MG/ML IJ SOLN
5.0000 mg | Freq: Four times a day (QID) | INTRAMUSCULAR | Status: DC | PRN
  Administered 2014-11-29: 5 mg via INTRAMUSCULAR
  Filled 2014-11-29: qty 1

## 2014-11-29 MED ORDER — LORAZEPAM 2 MG/ML IJ SOLN: 2.0000 mg | Freq: Once | INTRAMUSCULAR | Status: DC

## 2014-11-29 NOTE — BH Assessment (Signed)
[place] Assessment Progress Note  Pt has been referred to [facility].  At 09:59 Joneen Boers from the [facility] authorized [facility] referral, authorization [id number] from 11/29/2014 - 12/05/2014.  Please note that authorization does not mean that pt has been accepted to the facility.  At 10:08 I spoke to Robinette at [facility], providing demographic information by telephone.  Referral documents were then faxed to [facility], and at 11:04 Robinette confirmed receipt.  As of this writing a decision is pending.  Jalene Mullet, [place] Triage Specialist [phone]

## 2014-11-29 NOTE — Progress Notes (Signed)
CM discussed with ED SW 1) checking on status of residency at [facility] 2) geripsych 3) [facility] (refer to cna note on 11/29/14)
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
## 2014-11-29 NOTE — ED Notes (Signed)
Pt became agititated, aggressive and combative when Probation officer and nurse attempted to preform pericare Pt kicking and swinging arms coming in contact with other staff present. Pt continued to be agreesive and verbally abusive while we preformed pericare then we place pt into recliner chair.

## 2014-11-29 NOTE — ED Notes (Signed)
Patient now sleeping without difficulty.

## 2014-11-29 NOTE — BH Assessment (Signed)
11/29/2014 Reassessment:  Pt from [facility] Unit via EMS after displaying significant change in behavior. Patient presented to the [facility] on 11/25/2014. Patient has remained in the ED awaiting [facility] psych placement. She is difficult to understand as her speech is garbled. Patient is not exhibiting any aggressive behaviors at this time.   Per Dr. Darleene Cleaver and Waylan Boga, NP patient to remain in the ED for [place] placement.

## 2014-11-29 NOTE — ED Notes (Signed)
Patient becoming increasingly aggressive out at desk yelling at staff. MD made aware.
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
## 2014-11-29 NOTE — ED Notes (Signed)
Patient sleeping-patient is arousable to voice but continues to lay in bed with eyes closed.  Will reassess and administer PM medications when able.

## 2014-11-29 NOTE — ED Notes (Signed)
Attempted blood draw and patient said, "take it out bitch!, take it out!"  Unable to obtain labs.

## 2014-11-30 DIAGNOSIS — F919 Conduct disorder, unspecified: Secondary | ICD-10-CM | POA: Diagnosis not present

## 2014-11-30 MED ORDER — IPRATROPIUM-ALBUTEROL 0.5-2.5 (3) MG/3ML IN SOLN
3.0000 mL | Freq: Once | RESPIRATORY_TRACT | Status: AC
  Administered 2014-11-30: 3 mL via RESPIRATORY_TRACT
  Filled 2014-11-30: qty 3

## 2014-11-30 MED ORDER — IPRATROPIUM-ALBUTEROL 0.5-2.5 (3) MG/3ML IN SOLN
3.0000 mL | Freq: Four times a day (QID) | RESPIRATORY_TRACT | Status: DC | PRN
  Administered 2014-11-30 – 2014-12-01 (×2): 3 mL via RESPIRATORY_TRACT
  Filled 2014-11-30 (×3): qty 3

## 2014-11-30 NOTE — ED Notes (Signed)
Placed patient on 2L humidified oxygen.

## 2014-11-30 NOTE — ED Notes (Signed)
EKG done and copy to disposition staff.

## 2014-11-30 NOTE — Progress Notes (Addendum)
Cm spoke with Tia at [facility] to confirm pt has not been seen at the office, not an active pt but found in there system that pt dr listed as a Clent Demark When CM goggled this provider, he is listed in [place]  Number for [place] [phone] is busy x 3

## 2014-11-30 NOTE — ED Notes (Signed)
Post Nebulizer tx. Patient saturation 100% on 2L [place]. Wheezing has decreased and patient is resting comfortably.

## 2014-11-30 NOTE — ED Notes (Signed)
MD at bedside for assessment

## 2014-11-30 NOTE — Progress Notes (Signed)
ED CM spoke with EDP Plunkett about note needed for potential placement facility Dr Maryan Rued to assist  TTS staff Tom made aware

## 2014-11-30 NOTE — ED Notes (Signed)
Pt awaken for vitals. Pt waving hands and speaking in unclear speak. Calmed down and reoriented by NT and myself. Pt asking for breakfast. Given snake until breakfast arrives.

## 2014-11-30 NOTE — ED Notes (Signed)
Pt awoke coughing, positioned on her knees. The sitter noticed her nose is bleeding. RN at bedside to help control bleeding. Patient awake but lethargic from meds. Bleeding controlled with pressure using gauze. Pt instructed not to blow or aggravate nose by rubbing. Pt ambulated to the bathroom x1 assist.   This RN assisted the patient to bed and noticed expiratory and inspiratory wheezes throughout. Inhaler used. Vitals to be taken and respiratory called.

## 2014-12-01 DIAGNOSIS — F919 Conduct disorder, unspecified: Secondary | ICD-10-CM | POA: Diagnosis not present

## 2014-12-01 NOTE — BH Assessment (Signed)
Pt's son inquired about pt's location at [facility]. Pt's son reported that he is her power of attorney. This Probation officer informed him that he would need to contact [facility] to get pt's location once she is settled there.

## 2014-12-01 NOTE — BH Assessment (Signed)
Patient has been accepted to [facility] per [place] by Dr. Jeanette Caprice. Patient can come after 7pm. Call nurse report to [phone]. Informed patients nurses of acceptance.   Rosalin Hawking, LCSW Therapeutic Triage Specialist [place] 12/01/2014 5:53 PM

## 2014-12-01 NOTE — Progress Notes (Addendum)
Pt appears short of breath. No wheezes or rales heard. HOB elevated 90 degrees and oxygen at 2 liters nasal canula. MD made aware pts heart rate is 46 and at times does go to 51. Will continue to monitor closely. Pt is asymptomatic at this time. She requested grape juice and crackers with peanut butter. Pt remains a 1:1 for safety. Report given to Chartered loss adjuster at [place]. Repeat Hr is 58 at 6;40pm. Phoned [place] at 6:45pm-Phoned pts son at 7:40p-Son phoned back and stated that he would like to speak with the social worker. Phone number supplied to the son .

## 2014-12-01 NOTE — BH Assessment (Signed)
Patient was reassessed by TTS  Patient denies SI at this time. Patient states that she wants to "go home." patient states that she would like to talk with the doctor and this writer made her request known. Patient was speaking bizarrely and this writer was unable to interpret what else patient was saying.  Informed psychiatrist of request. Patient continues to meet inpatient criteria per Dr. Parke Poisson and Serena Colonel, NP.

## 2015-04-03 DEATH — deceased

## 2017-01-21 IMAGING — DX DG CHEST 1V PORT
1 series · 1 of 1 positions shown · non-contrast
Comparison: 04/17/2014.

CLINICAL DATA: Schizophrenia. Aggressive behavior and
hallucinations.

EXAM:
PORTABLE CHEST 1 VIEW

[chest ap]
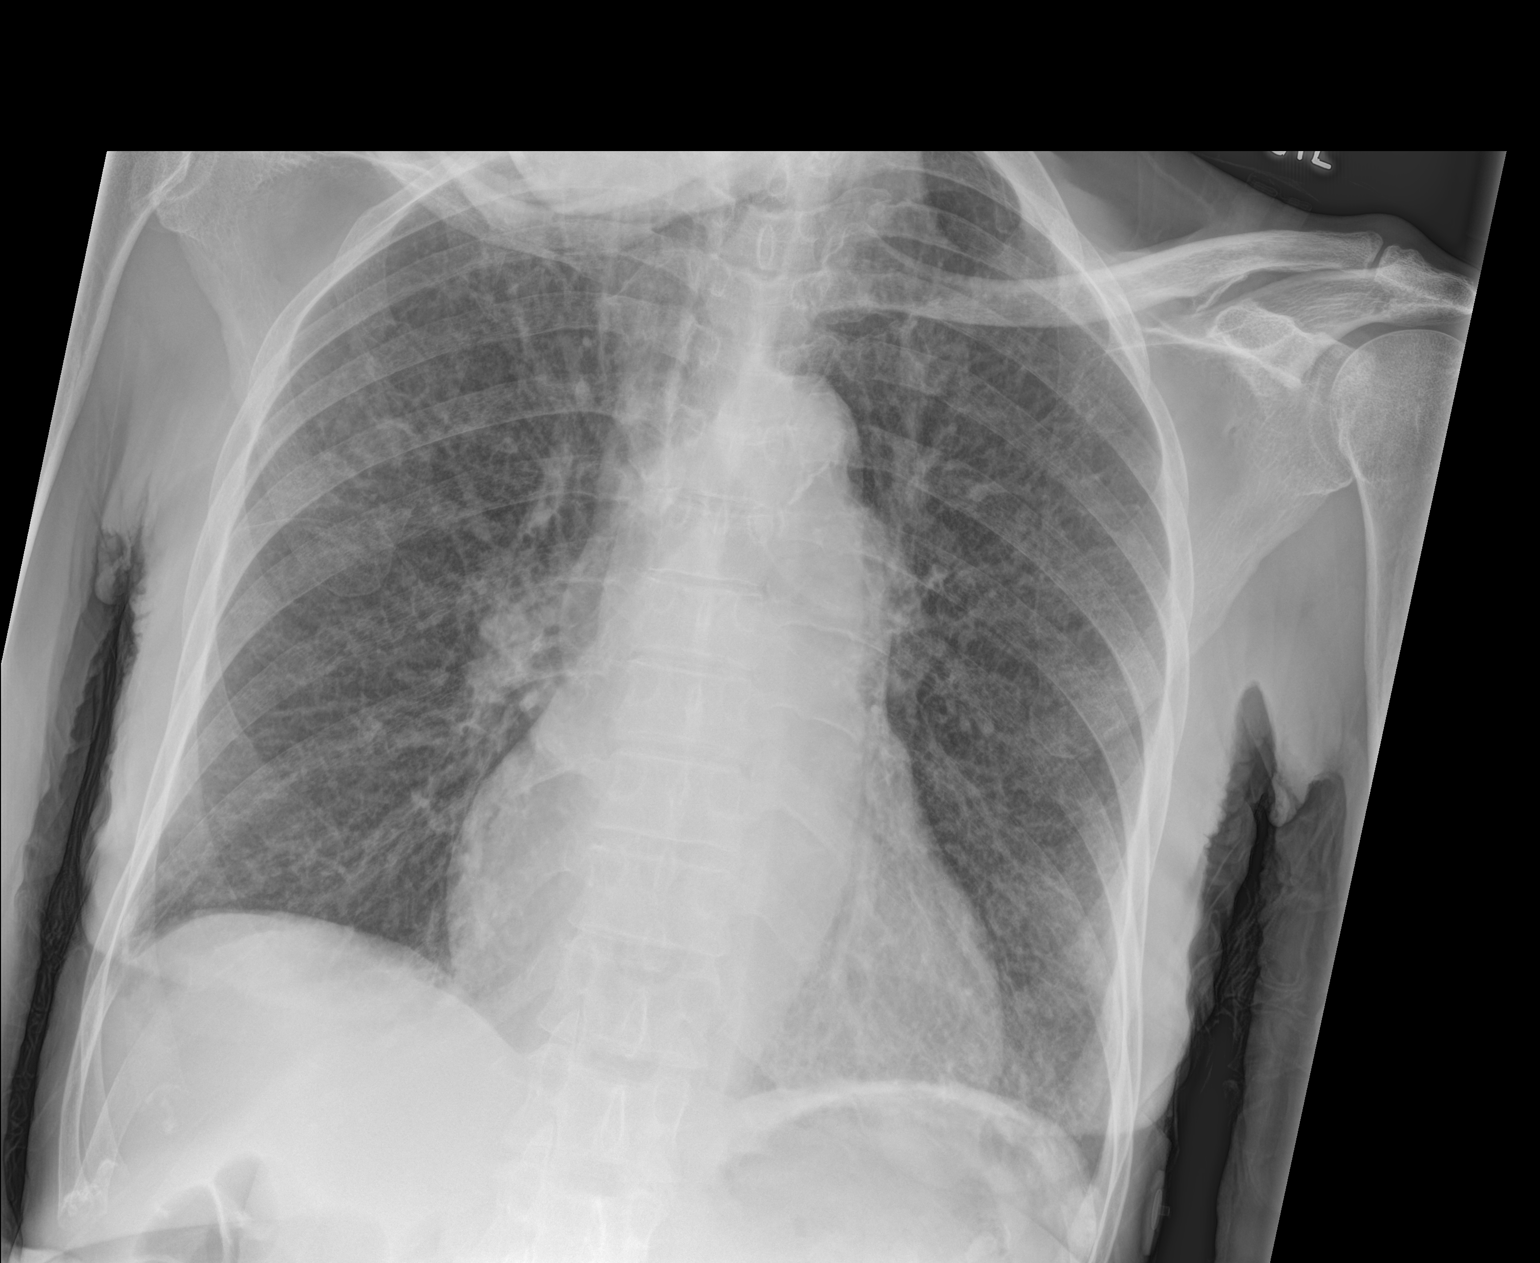

[1 of 1 positions shown; findings below may reference images not displayed]

FINDINGS: Heart size is normal. The aorta is unfolded. There is newly seen
interstitial prominence that could go along with early congestive
heart failure. No alveolar edema. No consolidation, collapse or
effusion. No acute bone finding.
IMPRESSION: Diffuse interstitial prominence, newly seen since the previous
study, suggesting interstitial edema.
